# Patient Record
Sex: Male | Born: 1950 | Race: White | Hispanic: No | Marital: Married | Smoking: Never smoker
Health system: Southern US, Community
[De-identification: ages and names within clinical notes are randomized; demographics above are authoritative.]

## PROBLEM LIST (undated history)

## (undated) HISTORY — PX: TONSILLECTOMY: SUR1361

## (undated) HISTORY — PX: DIAGNOSTIC LAPAROSCOPY: SUR761

## (undated) HISTORY — PX: HERNIA REPAIR: SHX51

---

## 2004-06-29 ENCOUNTER — Emergency Department (HOSPITAL_COMMUNITY): Admission: EM | Admit: 2004-06-29 | Discharge: 2004-06-29 | Payer: Self-pay | Admitting: Emergency Medicine

## 2004-07-29 ENCOUNTER — Ambulatory Visit (HOSPITAL_COMMUNITY): Admission: RE | Admit: 2004-07-29 | Discharge: 2004-07-29 | Payer: Self-pay | Admitting: Surgery

## 2011-10-07 ENCOUNTER — Other Ambulatory Visit: Payer: Self-pay | Admitting: Internal Medicine

## 2011-10-07 ENCOUNTER — Ambulatory Visit
Admission: RE | Admit: 2011-10-07 | Discharge: 2011-10-07 | Disposition: A | Discharge status: Home / Self Care | Payer: BC Managed Care – PPO | Source: Ambulatory Visit | Attending: Internal Medicine | Admitting: Internal Medicine

## 2011-10-07 DIAGNOSIS — N5089 Other specified disorders of the male genital organs: Secondary | ICD-10-CM

## 2013-06-10 ENCOUNTER — Other Ambulatory Visit: Payer: Self-pay | Admitting: Internal Medicine

## 2013-06-10 ENCOUNTER — Ambulatory Visit
Admission: RE | Admit: 2013-06-10 | Discharge: 2013-06-10 | Disposition: A | Discharge status: Home / Self Care | Payer: BC Managed Care – PPO | Source: Ambulatory Visit | Attending: Internal Medicine | Admitting: Internal Medicine

## 2013-06-10 DIAGNOSIS — M25511 Pain in right shoulder: Secondary | ICD-10-CM

## 2018-08-29 ENCOUNTER — Ambulatory Visit
Admission: RE | Admit: 2018-08-29 | Discharge: 2018-08-29 | Disposition: A | Discharge status: Home / Self Care | Payer: BC Managed Care – PPO | Source: Ambulatory Visit | Attending: Internal Medicine | Admitting: Internal Medicine

## 2018-08-29 ENCOUNTER — Other Ambulatory Visit: Payer: Self-pay | Admitting: Internal Medicine

## 2018-08-29 DIAGNOSIS — M549 Dorsalgia, unspecified: Secondary | ICD-10-CM

## 2019-01-01 ENCOUNTER — Ambulatory Visit: Payer: BC Managed Care – PPO | Attending: Internal Medicine

## 2019-01-01 DIAGNOSIS — Z20822 Contact with and (suspected) exposure to covid-19: Secondary | ICD-10-CM

## 2019-01-02 LAB — NOVEL CORONAVIRUS, NAA: SARS-CoV-2, NAA: DETECTED — AB

## 2019-01-03 ENCOUNTER — Encounter: Payer: Self-pay | Admitting: *Deleted

## 2019-01-04 HISTORY — PX: MELANOMA EXCISION: SHX5266

## 2019-01-30 DIAGNOSIS — C439 Malignant melanoma of skin, unspecified: Secondary | ICD-10-CM

## 2019-01-30 HISTORY — DX: Malignant melanoma of skin, unspecified: C43.9

## 2019-02-24 ENCOUNTER — Ambulatory Visit: Payer: BC Managed Care – PPO | Attending: Internal Medicine

## 2019-02-24 DIAGNOSIS — Z23 Encounter for immunization: Secondary | ICD-10-CM | POA: Insufficient documentation

## 2019-02-24 NOTE — Progress Notes (Signed)
   Covid-19 Vaccination Clinic  Name:  Steven Rogers    MRN: [id number] DOB: 1950/03/08  02/24/2019  [name] was observed post Covid-19 immunization for 15 minutes without incidence. He was provided with Vaccine Information Sheet and instruction to access the V-Safe system.   [name] was instructed to call 911 with any severe reactions post vaccine: Marland Kitchen Difficulty breathing  . Swelling of your face and throat  . A fast heartbeat  . A bad rash all over your body  . Dizziness and weakness    Immunizations Administered    Name Date Dose VIS Date Route   Pfizer COVID-19 Vaccine 02/24/2019 11:57 AM 0.3 mL 12/14/2018 Intramuscular   Manufacturer: [place]   Lot: [id number]   [place]: [id number]

## 2019-03-20 ENCOUNTER — Ambulatory Visit: Payer: BC Managed Care – PPO | Attending: Internal Medicine

## 2019-03-20 DIAGNOSIS — Z23 Encounter for immunization: Secondary | ICD-10-CM

## 2019-03-20 NOTE — Progress Notes (Signed)
   Covid-19 Vaccination Clinic  Name:  Steven Rogers    MRN: [id number] DOB: [birth year]  03/20/2019  Steven Rogers was observed post Covid-19 immunization for 15 minutes without incident. He was provided with Vaccine Information Sheet and instruction to access the V-Safe system.   Steven Rogers was instructed to call 911 with any severe reactions post vaccine: Marland Kitchen Difficulty breathing  . Swelling of face and throat  . A fast heartbeat  . A bad rash all over body  . Dizziness and weakness   Immunizations Administered    Name Date Dose VIS Date Route   Pfizer COVID-19 Vaccine 03/20/2019  8:18 AM 0.3 mL 12/14/2018 Intramuscular   Manufacturer: [place]   Lot: [id number]   [place]: [id number]

## 2019-12-03 ENCOUNTER — Ambulatory Visit: Payer: Medicare PPO | Admitting: Dermatology

## 2019-12-03 ENCOUNTER — Other Ambulatory Visit: Payer: Self-pay

## 2019-12-03 ENCOUNTER — Encounter: Payer: Self-pay | Admitting: Dermatology

## 2019-12-03 DIAGNOSIS — D485 Neoplasm of uncertain behavior of skin: Secondary | ICD-10-CM

## 2019-12-03 DIAGNOSIS — L570 Actinic keratosis: Secondary | ICD-10-CM

## 2019-12-03 DIAGNOSIS — Z85820 Personal history of malignant melanoma of skin: Secondary | ICD-10-CM

## 2019-12-03 DIAGNOSIS — Z1283 Encounter for screening for malignant neoplasm of skin: Secondary | ICD-10-CM

## 2019-12-03 NOTE — Patient Instructions (Signed)
Biopsy, Surgery (Curettage) & Surgery (Excision) Aftercare Instructions ? ?1. Okay to remove bandage in 24 hours ? ?2. Wash area with soap and water ? ?3. Apply Vaseline to area twice daily until healed (Not Neosporin) ? ?4. Okay to cover with a Band-Aid to decrease the chance of infection or prevent irritation from clothing; also it's okay to uncover lesion at home. ? ?5. Suture instructions: return to our office in 7-10 or 10-14 days for a nurse visit for suture removal. Variable healing with sutures, if pain or itching occurs call our office. It's okay to shower or bathe 24 hours after sutures are given. ? ?6. The following risks Seyler occur after a biopsy, curettage or excision: bleeding, scarring, discoloration, recurrence, infection (redness, yellow drainage, pain or swelling). ? ?7. For questions, concerns and results call our office at Monday-Thursday before 4pm & Friday before 3pm. Biopsy results will be available in 1 week. ? ?

## 2019-12-04 ENCOUNTER — Encounter: Payer: Self-pay | Admitting: Dermatology

## 2019-12-04 NOTE — Progress Notes (Signed)
   Follow-Up Visit   Subjective  Steven Rogers is a 69 y.o. male who presents for the following: Follow-up (MM 01/30/2019 UPPER MID BACK EXC =ST).  New bump left cheek. Location:  Duration:  Quality:  Associated Signs/Symptoms: Modifying Factors:  Severity:  Timing: Context: History of recent melanoma excision.  Objective  Well appearing patient in no apparent distress; mood and affect are within normal limits.  A full examination was performed including scalp, head, eyes, ears, nose, lips, neck, chest, axillae, abdomen, back, buttocks, bilateral upper extremities, bilateral lower extremities, hands, feet, fingers, toes, fingernails, and toenails. All findings within normal limits unless otherwise noted below.   Assessment & Plan    Screening exam for skin cancer Chest - Medial (Center)  Yearly skin checks  AK (actinic keratosis) Mid Forehead  PDT in the future.  Neoplasm of uncertain behavior of skin Left Parotid Area  Skin / nail biopsy Type of biopsy: tangential   Informed consent: discussed and consent obtained   Timeout: patient name, date of birth, surgical site, and procedure verified   Procedure prep:  Patient was prepped and draped in usual sterile fashion (Non sterile) Prep type:  Chlorhexidine Anesthesia: the lesion was anesthetized in a standard fashion   Anesthetic:  1% lidocaine w/ epinephrine 1-100,000 local infiltration Instrument used: flexible razor blade   Hemostasis achieved with: electrodesiccation   Outcome: patient tolerated procedure well   Post-procedure details: wound care instructions given    Specimen 1 - Surgical pathology Differential Diagnosis: R/O BCC vs SCC Check Margins: No Cautery after biopsy  Personal history of malignant melanoma of skin Mid Back  Self examine skin twice annually, dermatologist examination yearly plus as needed.  Encounter for screening for malignant neoplasm of skin Mid Back  Annual skin  examination.     I, Lavonna Monarch, MD, have reviewed all documentation for this visit.  The documentation on 12/04/19 for the exam, diagnosis, procedures, and orders are all accurate and complete.

## 2020-03-09 ENCOUNTER — Other Ambulatory Visit: Payer: Self-pay | Admitting: Surgery

## 2020-03-09 DIAGNOSIS — K4090 Unilateral inguinal hernia, without obstruction or gangrene, not specified as recurrent: Secondary | ICD-10-CM

## 2020-03-24 ENCOUNTER — Ambulatory Visit
Admission: RE | Admit: 2020-03-24 | Discharge: 2020-03-24 | Disposition: A | Discharge status: Home / Self Care | Payer: Medicare PPO | Source: Ambulatory Visit | Attending: Surgery | Admitting: Surgery

## 2020-03-24 DIAGNOSIS — K4090 Unilateral inguinal hernia, without obstruction or gangrene, not specified as recurrent: Secondary | ICD-10-CM

## 2020-03-24 MED ORDER — IOPAMIDOL (ISOVUE-300) INJECTION 61%
100.0000 mL | Freq: Once | INTRAVENOUS | Status: AC | PRN
  Administered 2020-03-24: 100 mL via INTRAVENOUS

## 2020-04-03 ENCOUNTER — Ambulatory Visit: Payer: Self-pay | Admitting: General Surgery

## 2020-04-03 NOTE — H&P (Signed)
tory of Present Illness Ralene Ok MD; 04/03/2020 10:54 AM) The patient is a 70 year old male who presents with an inguinal hernia. Patient is a 70 year old male who follows up today after CT scan. Patient states she's had a long history of a left inguinal hernia. Patient's had some fullness to his left scrotum. Patient CT scan revealed a large fat-containing inguinal hernia. I did review the scans personally. Patient also with a small umbilical hernia.  Patient is a previous laparoscopic right hernia repair with mesh.    ------------------------------ Referred by Dr. Franchot Gallo for possible left inguinal hernia  This is a 70 year old male with a past history of right inguinal hernia repair with mesh who presents with several years of slowly enlarging swelling in his left hemiscrotum. Previously had been told that he had a hydrocele in this area. However recently he underwent a repeat ultrasound of his scrotum that was read as no hydrocele but a fatty mass in the left scrotum. This was felt to possibly represent a left inguinal hernia so he is referred to [place] for evaluation. This area is not reducible. He reports no constipation or difficulty with bowel movements.  The patient incidentally has also had a small umbilical hernia his entire life. This has gotten slightly larger but is not causing any discomfort. He also incidentally had lumbar films 2 years ago that showed calcified gallstones. He denies any gastrointestinal symptoms that could be related to his gallbladder.    Physical Exam Ralene Ok, MD; 04/03/2020 10:55 AM) The physical exam findings are as follows: Note: Constitutional: WDWN in NAD, conversant, no obvious deformities; resting comfortably Eyes: Pupils equal, round; sclera anicteric; moist conjunctiva; no lid lag HENT: Oral mucosa moist; good dentition Neck: No masses palpated, trachea midline; no thyromegaly Lungs: CTA bilaterally; normal  respiratory effort CV: Regular rate and rhythm; no murmurs; extremities well-perfused with no edema Abd: +bowel sounds, soft, non-tender, no palpable organomegaly; wide umbilicus with small palpable defect GU: bilateral descended testes; significant fullness in left hemiscrotum; no bulge or swelling in the left groin with Valsalva Musc: Normal gait; no apparent clubbing or cyanosis in extremities Lymphatic: No palpable cervical or axillary lymphadenopathy Skin: Warm, dry; no sign of jaundice Psychiatric - alert and oriented x 4; calm mood and affect    Assessment & Plan Ralene Ok MD; 04/03/2020 10:55 AM) LEFT INGUINAL HERNIA (K40.90) Impression: Patient is a 70 year old male, with history of hyperlipidemia, with a large left inguinal scrotal hernia. 1. The patient will like to proceed to the operating room for robotic left inguinal hernia repair with mesh, and umbilical hernia repair with mesh..  2. I discussed with the patient the signs and symptoms of incarceration and strangulation and the need to proceed to the ER should they occur.  3. I discussed with the patient the risks and benefits of the procedure to include but not limited to: Infection, bleeding, damage to surrounding structures, possible need for further surgery, possible nerve pain, and possible recurrence. The patient was understanding and wishes to proceed. Current Plans You are being scheduled for surgery- Our schedulers will call you.  You should hear from our office's scheduling department within 5 working days about the location, date, and time of surgery. We try to make accommodations for patient's preferences in scheduling surgery, but sometimes the OR schedule or the surgeon's schedule prevents [place] from making those accommodations.  If you have not heard from our office [phone]) in 5 working days, call the office and ask  for your surgeon's nurse.  If you have other questions about your diagnosis,  plan, or surgery, call the office and ask for your surgeon's nurse.  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 This Garron represent a scrotal lipoma. We will obtain a CT scan of the abdomen and pelvis to evaluate his gallbladder, umbilical hernia for possible bowel incarceration, as well as to rule out a left inguinal hernia. We will see the patient back after his CT scan.

## 2020-06-12 ENCOUNTER — Other Ambulatory Visit (HOSPITAL_COMMUNITY): Payer: Medicare PPO

## 2020-06-16 ENCOUNTER — Ambulatory Visit: Admit: 2020-06-16 | Payer: Medicare PPO | Admitting: General Surgery

## 2020-06-16 SURGERY — REPAIR, HERNIA, INGUINAL, ROBOT-ASSISTED, LAPAROSCOPIC, USING MESH
Anesthesia: General

## 2020-07-08 NOTE — Progress Notes (Addendum)
Surgical Instructions    Your procedure is scheduled on 07/14/20.  Report to [facility] Main Entrance "A" at 11:30 A.M., then check in with the Admitting office.  Call this number if you have problems the morning of surgery:  [phone]   If you have any questions prior to your surgery date call [phone]: Open Monday-Friday 8am-4pm    Remember:  Do not eat after midnight the night before your surgery  You Briguglio drink clear liquids until 10:30 AM the morning of your surgery.   Clear liquids allowed are: Water, Non-Citrus Juices (without pulp), Carbonated Beverages, Clear Tea, Black Coffee Only, and Gatorade   Patient Instructions  The night before surgery:  No food after midnight. ONLY clear liquids after midnight  The day of surgery (if you do NOT have diabetes):  Drink ONE (1) Pre-Surgery Clear Ensure by 10:30 AM the morning of surgery. Drink in one sitting. Do not sip.  This drink was given to you during your hospital  pre-op appointment visit.  Nothing else to drink after completing the  Pre-Surgery Clear Ensure.          If you have questions, please contact your surgeon's office.    Take these medicines the morning of surgery with A SIP OF WATER  atorvastatin (LIPITOR)   If needed: cetirizine (ZYRTEC)   As of today, STOP taking any Aspirin (unless otherwise instructed by your surgeon) Aleve, Naproxen, Ibuprofen, Motrin, Advil, Goody's, BC's, all herbal medications, fish oil, and all vitamins.          Do not wear jewelry  Do not wear lotions, powders, colognes, or deodorant. Do not shave 48 hours prior to surgery.  Men Golla shave face and neck. Do not bring valuables to the hospital.   [facility] is not responsible for any belongings or valuables.  Do NOT Smoke (Tobacco/Vaping) or drink Alcohol 24 hours prior to your procedure If you use a CPAP at night, you Krouse bring all equipment for your overnight stay.   Contacts, glasses, dentures or bridgework [place]  not be worn into surgery, please bring cases for these belongings   For patients admitted to the hospital, discharge time will be determined by your treatment team.   Patients discharged the day of surgery will not be allowed to drive home, and someone needs to stay with them for 24 hours.  ONLY 1 SUPPORT PERSON Carden BE PRESENT WHILE YOU ARE IN SURGERY. IF YOU ARE TO BE ADMITTED ONCE YOU ARE IN YOUR ROOM YOU WILL BE ALLOWED TWO (2) VISITORS.  Minor children Keithley have two parents present. Special consideration for safety and communication needs will be reviewed on a case by case basis.  Special instructions:    Oral Hygiene is also important to reduce your risk of infection.  Remember - BRUSH YOUR TEETH THE MORNING OF SURGERY WITH YOUR REGULAR TOOTHPASTE   [place]- Preparing For Surgery  Before surgery, you can play an important role. Because skin is not sterile, your skin needs to be as free of germs as possible. You can reduce the number of germs on your skin by washing with CHG (chlorahexidine gluconate) Soap before surgery.  CHG is an antiseptic cleaner which kills germs and bonds with the skin to continue killing germs even after washing.     Please do not use if you have an allergy to CHG or antibacterial soaps. If your skin becomes reddened/irritated stop using the CHG.  Do not shave (including legs and underarms)  for at least 48 hours prior to first CHG shower. It is OK to shave your face.  Please follow these instructions carefully.     Shower the NIGHT BEFORE SURGERY and the MORNING OF SURGERY with CHG Soap.   If you chose to wash your hair, wash your hair first as usual with your normal shampoo. After you shampoo, rinse your hair and body thoroughly to remove the shampoo.  Then [employer] and genitals (private parts) with your normal soap and rinse thoroughly to remove soap.  After that Use CHG Soap as you would any other liquid soap. You can apply CHG directly to the skin and  wash gently with a scrungie or a clean washcloth.   Apply the CHG Soap to your body ONLY FROM THE NECK DOWN.  Do not use on open wounds or open sores. Avoid contact with your eyes, ears, mouth and genitals (private parts). Wash Face and genitals (private parts)  with your normal soap.   Wash thoroughly, paying special attention to the area where your surgery will be performed.  Thoroughly rinse your body with warm water from the neck down.  DO NOT shower/wash with your normal soap after using and rinsing off the CHG Soap.  Pat yourself dry with a CLEAN TOWEL.  Wear CLEAN PAJAMAS to bed the night before surgery  Place CLEAN SHEETS on your bed the night before your surgery  DO NOT SLEEP WITH PETS.   Day of Surgery:  Take a shower with CHG soap. Wear Clean/Comfortable clothing the morning of surgery Do not apply any deodorants/lotions.   Remember to brush your teeth WITH YOUR REGULAR TOOTHPASTE.   Please read over the following fact sheets that you were given.

## 2020-07-09 ENCOUNTER — Other Ambulatory Visit: Payer: Self-pay

## 2020-07-09 ENCOUNTER — Encounter (HOSPITAL_COMMUNITY)
Admission: RE | Admit: 2020-07-09 | Discharge: 2020-07-09 | Disposition: A | Discharge status: Home / Self Care | Payer: Medicare PPO | Source: Ambulatory Visit | Attending: General Surgery | Admitting: General Surgery

## 2020-07-09 ENCOUNTER — Encounter (HOSPITAL_COMMUNITY): Payer: Self-pay

## 2020-07-09 DIAGNOSIS — Z01812 Encounter for preprocedural laboratory examination: Secondary | ICD-10-CM | POA: Diagnosis not present

## 2020-07-09 LAB — CBC
HCT: 45.6 % (ref 39.0–52.0)
Hemoglobin: 15.5 g/dL (ref 13.0–17.0)
MCH: 34.5 pg — ABNORMAL HIGH (ref 26.0–34.0)
MCHC: 34 g/dL (ref 30.0–36.0)
MCV: 101.6 fL — ABNORMAL HIGH (ref 80.0–100.0)
Platelets: 194 10*3/uL (ref 150–400)
RBC: 4.49 MIL/uL (ref 4.22–5.81)
RDW: 13.1 % (ref 11.5–15.5)
WBC: 6.3 10*3/uL (ref 4.0–10.5)
nRBC: 0 % (ref 0.0–0.2)

## 2020-07-09 NOTE — Progress Notes (Signed)
PCP -  Lorene Dy, MD  PPM/ICD - denies  Chest x-ray - denies EKG - denies Stress Test - pt states "years ago." No issues found ECHO - denies Cardiac Cath - denies  Sleep Study - denies  DM: denies   ERAS Protcol - yes PRE-SURGERY Ensure   COVID TEST- n/a ambulatory surgery   Anesthesia review: no  Patient denies shortness of breath, fever, cough and chest pain at PAT appointment   All instructions explained to the patient, with a verbal understanding of the material. Patient agrees to go over the instructions while at home for a better understanding. Patient also instructed to self quarantine after being tested for COVID-19. The opportunity to ask questions was provided.

## 2020-07-14 ENCOUNTER — Encounter (HOSPITAL_COMMUNITY): Payer: Self-pay | Admitting: General Surgery

## 2020-07-14 ENCOUNTER — Ambulatory Visit (HOSPITAL_COMMUNITY): Payer: Medicare PPO | Admitting: Certified Registered"

## 2020-07-14 ENCOUNTER — Encounter (HOSPITAL_COMMUNITY)
Admission: RE | Disposition: A | Discharge status: Home / Self Care | Payer: Self-pay | Source: Home / Self Care | Attending: General Surgery

## 2020-07-14 ENCOUNTER — Other Ambulatory Visit: Payer: Self-pay

## 2020-07-14 ENCOUNTER — Ambulatory Visit (HOSPITAL_COMMUNITY)
Admission: RE | Admit: 2020-07-14 | Discharge: 2020-07-14 | Disposition: A | Discharge status: Home / Self Care | Payer: Medicare PPO | Attending: General Surgery | Admitting: General Surgery

## 2020-07-14 DIAGNOSIS — K4030 Unilateral inguinal hernia, with obstruction, without gangrene, not specified as recurrent: Secondary | ICD-10-CM | POA: Diagnosis not present

## 2020-07-14 DIAGNOSIS — K4090 Unilateral inguinal hernia, without obstruction or gangrene, not specified as recurrent: Secondary | ICD-10-CM | POA: Diagnosis present

## 2020-07-14 DIAGNOSIS — K429 Umbilical hernia without obstruction or gangrene: Secondary | ICD-10-CM | POA: Diagnosis not present

## 2020-07-14 DIAGNOSIS — E785 Hyperlipidemia, unspecified: Secondary | ICD-10-CM | POA: Diagnosis not present

## 2020-07-14 HISTORY — PX: UMBILICAL HERNIA REPAIR: SHX196

## 2020-07-14 HISTORY — PX: XI ROBOTIC ASSISTED INGUINAL HERNIA REPAIR WITH MESH: SHX6706

## 2020-07-14 SURGERY — REPAIR, HERNIA, INGUINAL, ROBOT-ASSISTED, LAPAROSCOPIC, USING MESH
Anesthesia: General | Site: Abdomen

## 2020-07-14 MED ORDER — ROCURONIUM BROMIDE 10 MG/ML (PF) SYRINGE
PREFILLED_SYRINGE | INTRAVENOUS | Status: DC | PRN
  Administered 2020-07-14: 40 mg via INTRAVENOUS
  Administered 2020-07-14: 60 mg via INTRAVENOUS

## 2020-07-14 MED ORDER — DEXMEDETOMIDINE (PRECEDEX) IN NS 20 MCG/5ML (4 MCG/ML) IV SYRINGE
PREFILLED_SYRINGE | INTRAVENOUS | Status: DC | PRN
  Administered 2020-07-14 (×2): 8 ug via INTRAVENOUS
  Administered 2020-07-14: 4 ug via INTRAVENOUS

## 2020-07-14 MED ORDER — PROPOFOL 10 MG/ML IV BOLUS
INTRAVENOUS | Status: DC | PRN
  Administered 2020-07-14: 160 mg via INTRAVENOUS

## 2020-07-14 MED ORDER — BUPIVACAINE HCL 0.25 % IJ SOLN: INTRAMUSCULAR | Status: DC | PRN

## 2020-07-14 MED ORDER — CHLORHEXIDINE GLUCONATE 0.12 % MT SOLN
15.0000 mL | Freq: Once | OROMUCOSAL | Status: AC
  Administered 2020-07-14: 15 mL via OROMUCOSAL
  Filled 2020-07-14: qty 15

## 2020-07-14 MED ORDER — 0.9 % SODIUM CHLORIDE (POUR BTL) OPTIME
TOPICAL | Status: DC | PRN
  Administered 2020-07-14: 1000 mL

## 2020-07-14 MED ORDER — SUGAMMADEX SODIUM 200 MG/2ML IV SOLN
INTRAVENOUS | Status: DC | PRN
  Administered 2020-07-14: 200 mg via INTRAVENOUS

## 2020-07-14 MED ORDER — BUPIVACAINE-EPINEPHRINE (PF) 0.25% -1:200000 IJ SOLN
INTRAMUSCULAR | Status: AC
  Filled 2020-07-14: qty 30

## 2020-07-14 MED ORDER — ONDANSETRON HCL 4 MG/2ML IJ SOLN
INTRAMUSCULAR | Status: AC
  Filled 2020-07-14: qty 2

## 2020-07-14 MED ORDER — LIDOCAINE 2% (20 MG/ML) 5 ML SYRINGE
INTRAMUSCULAR | Status: DC | PRN
  Administered 2020-07-14: 100 mg via INTRAVENOUS

## 2020-07-14 MED ORDER — ONDANSETRON HCL 4 MG/2ML IJ SOLN
INTRAMUSCULAR | Status: DC | PRN
  Administered 2020-07-14: 4 mg via INTRAVENOUS

## 2020-07-14 MED ORDER — DEXAMETHASONE SODIUM PHOSPHATE 10 MG/ML IJ SOLN
INTRAMUSCULAR | Status: AC
  Filled 2020-07-14: qty 1

## 2020-07-14 MED ORDER — FENTANYL CITRATE (PF) 100 MCG/2ML IJ SOLN: 25.0000 ug | INTRAMUSCULAR | Status: DC | PRN

## 2020-07-14 MED ORDER — CEFAZOLIN SODIUM-DEXTROSE 2-4 GM/100ML-% IV SOLN
INTRAVENOUS | Status: AC
  Filled 2020-07-14: qty 100

## 2020-07-14 MED ORDER — LACTATED RINGERS IV SOLN: INTRAVENOUS | Status: DC

## 2020-07-14 MED ORDER — PHENYLEPHRINE 40 MCG/ML (10ML) SYRINGE FOR IV PUSH (FOR BLOOD PRESSURE SUPPORT)
PREFILLED_SYRINGE | INTRAVENOUS | Status: AC
  Filled 2020-07-14: qty 10

## 2020-07-14 MED ORDER — FENTANYL CITRATE (PF) 250 MCG/5ML IJ SOLN
INTRAMUSCULAR | Status: AC
  Filled 2020-07-14: qty 5

## 2020-07-14 MED ORDER — ROCURONIUM BROMIDE 10 MG/ML (PF) SYRINGE
PREFILLED_SYRINGE | INTRAVENOUS | Status: AC
  Filled 2020-07-14: qty 10

## 2020-07-14 MED ORDER — TRAMADOL HCL 50 MG PO TABS
50.0000 mg | ORAL_TABLET | Freq: Once | ORAL | Status: AC
  Administered 2020-07-14: 50 mg via ORAL

## 2020-07-14 MED ORDER — TRAMADOL HCL 50 MG PO TABS: 50.0000 mg | ORAL_TABLET | Freq: Four times a day (QID) | ORAL | 0 refills | Status: AC | PRN

## 2020-07-14 MED ORDER — LIDOCAINE 2% (20 MG/ML) 5 ML SYRINGE
INTRAMUSCULAR | Status: AC
  Filled 2020-07-14: qty 5

## 2020-07-14 MED ORDER — BUPIVACAINE-EPINEPHRINE 0.5% -1:200000 IJ SOLN
INTRAMUSCULAR | Status: DC | PRN
  Administered 2020-07-14: 8 mL

## 2020-07-14 MED ORDER — PROPOFOL 10 MG/ML IV BOLUS
INTRAVENOUS | Status: AC
  Filled 2020-07-14: qty 20

## 2020-07-14 MED ORDER — BUPIVACAINE LIPOSOME 1.3 % IJ SUSP
INTRAMUSCULAR | Status: DC | PRN
  Administered 2020-07-14: 20 mL

## 2020-07-14 MED ORDER — TRAMADOL HCL 50 MG PO TABS
ORAL_TABLET | ORAL | Status: AC
  Filled 2020-07-14: qty 1

## 2020-07-14 MED ORDER — BUPIVACAINE LIPOSOME 1.3 % IJ SUSP
INTRAMUSCULAR | Status: AC
  Filled 2020-07-14: qty 20

## 2020-07-14 MED ORDER — SODIUM CHLORIDE (PF) 0.9 % IJ SOLN
INTRAMUSCULAR | Status: DC | PRN
  Administered 2020-07-14: 20 mL

## 2020-07-14 MED ORDER — PHENYLEPHRINE HCL (PRESSORS) 10 MG/ML IV SOLN
INTRAVENOUS | Status: DC | PRN
  Administered 2020-07-14: 120 ug via INTRAVENOUS
  Administered 2020-07-14 (×2): 80 ug via INTRAVENOUS

## 2020-07-14 MED ORDER — FENTANYL CITRATE (PF) 100 MCG/2ML IJ SOLN
INTRAMUSCULAR | Status: DC | PRN
  Administered 2020-07-14: 100 ug via INTRAVENOUS
  Administered 2020-07-14 (×3): 50 ug via INTRAVENOUS

## 2020-07-14 MED ORDER — CEFAZOLIN SODIUM-DEXTROSE 2-3 GM-%(50ML) IV SOLR
INTRAVENOUS | Status: DC | PRN
  Administered 2020-07-14: 2 g via INTRAVENOUS

## 2020-07-14 MED ORDER — ACETAMINOPHEN 500 MG PO TABS
ORAL_TABLET | ORAL | Status: AC
  Administered 2020-07-14: 1000 mg
  Filled 2020-07-14: qty 2

## 2020-07-14 MED ORDER — ORAL CARE MOUTH RINSE: 15.0000 mL | Freq: Once | OROMUCOSAL | Status: AC

## 2020-07-14 SURGICAL SUPPLY — 84 items
ADH SKN CLS APL DERMABOND .7 (GAUZE/BANDAGES/DRESSINGS) ×2
APL PRP STRL LF DISP 70% ISPRP (MISCELLANEOUS) ×2
BAG COUNTER SPONGE SURGICOUNT (BAG) ×3 IMPLANT
BAG SPNG CNTER NS LX DISP (BAG) ×2
BAG SURGICOUNT SPONGE COUNTING (BAG) ×1
BIOPATCH RED 1 DISK 7.0 (GAUZE/BANDAGES/DRESSINGS) ×1 IMPLANT
BIOPATCH RED 1IN DISK 7.0MM (GAUZE/BANDAGES/DRESSINGS) ×1
BLADE CLIPPER SURG (BLADE) IMPLANT
CANISTER SUCT 3000ML PPV (MISCELLANEOUS) ×4 IMPLANT
CHLORAPREP W/TINT 26 (MISCELLANEOUS) ×4 IMPLANT
COVER MAYO STAND STRL (DRAPES) ×4 IMPLANT
COVER SURGICAL LIGHT HANDLE (MISCELLANEOUS) ×4 IMPLANT
COVER TIP SHEARS 8 DVNC (MISCELLANEOUS) ×2 IMPLANT
COVER TIP SHEARS 8MM DA VINCI (MISCELLANEOUS) ×4
DECANTER SPIKE VIAL GLASS SM (MISCELLANEOUS) ×4 IMPLANT
DEFOGGER SCOPE WARMER CLEARIFY (MISCELLANEOUS) ×4 IMPLANT
DERMABOND ADVANCED (GAUZE/BANDAGES/DRESSINGS) ×2
DERMABOND ADVANCED .7 DNX12 (GAUZE/BANDAGES/DRESSINGS) ×2 IMPLANT
DEVICE TROCAR PUNCTURE CLOSURE (ENDOMECHANICALS) ×4 IMPLANT
DRAIN JP 10F RND RADIO (DRAIN) ×2 IMPLANT
DRAPE ARM DVNC X/XI (DISPOSABLE) ×8 IMPLANT
DRAPE COLUMN DVNC XI (DISPOSABLE) ×2 IMPLANT
DRAPE CV SPLIT W-CLR ANES SCRN (DRAPES) ×4 IMPLANT
DRAPE DA VINCI XI ARM (DISPOSABLE) ×16
DRAPE DA VINCI XI COLUMN (DISPOSABLE) ×4
DRAPE LAPAROTOMY 100X72 PEDS (DRAPES) ×2 IMPLANT
DRAPE ORTHO SPLIT 77X108 STRL (DRAPES) ×4
DRAPE SURG ORHT 6 SPLT 77X108 (DRAPES) ×2 IMPLANT
DRSG TEGADERM 2-3/8X2-3/4 SM (GAUZE/BANDAGES/DRESSINGS) ×2 IMPLANT
ELECT REM PT RETURN 9FT ADLT (ELECTROSURGICAL) ×4
ELECTRODE REM PT RTRN 9FT ADLT (ELECTROSURGICAL) ×2 IMPLANT
EVACUATOR SILICONE 100CC (DRAIN) ×2 IMPLANT
GAUZE 4X4 16PLY ~~LOC~~+RFID DBL (SPONGE) ×4 IMPLANT
GLOVE SRG 8 PF TXTR STRL LF DI (GLOVE) ×2 IMPLANT
GLOVE SURG ENC MOIS LTX SZ7.5 (GLOVE) ×8 IMPLANT
GLOVE SURG UNDER POLY LF SZ8 (GLOVE) ×4
GOWN STRL REUS W/ TWL LRG LVL3 (GOWN DISPOSABLE) ×4 IMPLANT
GOWN STRL REUS W/ TWL XL LVL3 (GOWN DISPOSABLE) ×4 IMPLANT
GOWN STRL REUS W/TWL 2XL LVL3 (GOWN DISPOSABLE) ×4 IMPLANT
GOWN STRL REUS W/TWL LRG LVL3 (GOWN DISPOSABLE) ×8
GOWN STRL REUS W/TWL XL LVL3 (GOWN DISPOSABLE) ×8
KIT BASIN OR (CUSTOM PROCEDURE TRAY) ×4 IMPLANT
KIT TURNOVER KIT B (KITS) ×4 IMPLANT
MARKER SKIN DUAL TIP RULER LAB (MISCELLANEOUS) ×4 IMPLANT
MESH PROGRIP LAP SELF FIXATING (Mesh General) ×4 IMPLANT
MESH PROGRIP LAP SLF FIX 16X12 (Mesh General) ×2 IMPLANT
MESH VENTRALEX ST 2.5 CRC MED (Mesh General) ×2 IMPLANT
NDL HYPO 25GX1X1/2 BEV (NEEDLE) ×2 IMPLANT
NDL INSUFFLATION 14GA 120MM (NEEDLE) ×2 IMPLANT
NEEDLE HYPO 22GX1.5 SAFETY (NEEDLE) ×4 IMPLANT
NEEDLE HYPO 25GX1X1/2 BEV (NEEDLE) ×4 IMPLANT
NEEDLE INSUFFLATION 14GA 120MM (NEEDLE) ×4 IMPLANT
NS IRRIG 1000ML POUR BTL (IV SOLUTION) ×4 IMPLANT
OBTURATOR OPTICAL STANDARD 8MM (TROCAR)
OBTURATOR OPTICAL STND 8 DVNC (TROCAR)
OBTURATOR OPTICALSTD 8 DVNC (TROCAR) IMPLANT
PACK GENERAL/GYN (CUSTOM PROCEDURE TRAY) ×4 IMPLANT
PAD ARMBOARD 7.5X6 YLW CONV (MISCELLANEOUS) ×8 IMPLANT
PENCIL SMOKE EVACUATOR (MISCELLANEOUS) ×6 IMPLANT
SEAL CANN UNIV 5-8 DVNC XI (MISCELLANEOUS) ×4 IMPLANT
SEAL XI 5MM-8MM UNIVERSAL (MISCELLANEOUS) ×8
SET IRRIG TUBING LAPAROSCOPIC (IRRIGATION / IRRIGATOR) IMPLANT
SET TUBE SMOKE EVAC HIGH FLOW (TUBING) ×4 IMPLANT
STOPCOCK 4 WAY LG BORE MALE ST (IV SETS) ×4 IMPLANT
SUT ETHIBOND 0 MO6 C/R (SUTURE) ×2 IMPLANT
SUT ETHILON 2 0 FS 18 (SUTURE) ×2 IMPLANT
SUT MNCRL AB 4-0 PS2 18 (SUTURE) ×4 IMPLANT
SUT PROLENE 0 CT 1 30 (SUTURE) ×6 IMPLANT
SUT VIC AB 2-0 CT1 27 (SUTURE)
SUT VIC AB 2-0 CT1 TAPERPNT 27 (SUTURE) ×2 IMPLANT
SUT VIC AB 2-0 SH 27 (SUTURE)
SUT VIC AB 2-0 SH 27X BRD (SUTURE) IMPLANT
SUT VIC AB 3-0 SH 27 (SUTURE) ×8
SUT VIC AB 3-0 SH 27XBRD (SUTURE) ×2 IMPLANT
SUT VLOC 180 2-0 6IN GS21 (SUTURE) ×6 IMPLANT
SUT VLOC 180 2-0 9IN GS21 (SUTURE) ×2 IMPLANT
SYR 30ML SLIP (SYRINGE) ×4 IMPLANT
SYR CONTROL 10ML LL (SYRINGE) ×4 IMPLANT
SYR TOOMEY 50ML (SYRINGE) ×4 IMPLANT
TOWEL GREEN STERILE (TOWEL DISPOSABLE) ×4 IMPLANT
TOWEL GREEN STERILE FF (TOWEL DISPOSABLE) ×4 IMPLANT
TRAY FOLEY MTR SLVR 14FR STAT (SET/KITS/TRAYS/PACK) IMPLANT
TRAY FOLEY MTR SLVR 16FR STAT (SET/KITS/TRAYS/PACK) IMPLANT
TRAY LAPAROSCOPIC MC (CUSTOM PROCEDURE TRAY) ×4 IMPLANT

## 2020-07-14 NOTE — Anesthesia Procedure Notes (Signed)
Procedure Name: Intubation Date/Time: 07/14/2020 1:30 PM Performed by: Myna Bright, CRNA Pre-anesthesia Checklist: Patient identified, Emergency Drugs available, Suction available and Patient being monitored Patient Re-evaluated:Patient Re-evaluated prior to induction Oxygen Delivery Method: Circle System Utilized Preoxygenation: Pre-oxygenation with 100% oxygen Induction Type: IV induction Ventilation: Mask ventilation without difficulty Laryngoscope Size: Miller and 2 Tube type: Oral Tube size: 7.5 mm Number of attempts: 1 Airway Equipment and Method: Stylet and Oral airway Placement Confirmation: ETT inserted through vocal cords under direct vision, positive ETCO2 and breath sounds checked- equal and bilateral Tube secured with: Tape Dental Injury: Teeth and Oropharynx as per pre-operative assessment

## 2020-07-14 NOTE — Anesthesia Preprocedure Evaluation (Signed)
Anesthesia Evaluation  Patient identified by MRN, date of birth, ID band Patient awake    Reviewed: Allergy & Precautions, NPO status , Patient's Chart, lab work & pertinent test results  Airway Mallampati: II  TM Distance: >3 FB Neck ROM: Full    Dental  (+) Teeth Intact, Dental Advisory Given   Pulmonary neg pulmonary ROS,    Pulmonary exam normal breath sounds clear to auscultation       Cardiovascular negative cardio ROS Normal cardiovascular exam Rhythm:Regular Rate:Normal     Neuro/Psych negative neurological ROS     GI/Hepatic Neg liver ROS, Inguinal and umbilical hernia    Endo/Other  Obesity   Renal/GU negative Renal ROS     Musculoskeletal negative musculoskeletal ROS (+)   Abdominal   Peds  Hematology negative hematology ROS (+)   Anesthesia Other Findings Day of surgery medications reviewed with the patient.  Reproductive/Obstetrics                             Anesthesia Physical Anesthesia Plan  ASA: 2  Anesthesia Plan: General   Post-op Pain Management:    Induction: Intravenous  PONV Risk Score and Plan: 2 and Midazolam, Dexamethasone and Ondansetron  Airway Management Planned: Oral ETT  Additional Equipment:   Intra-op Plan:   Post-operative Plan: Extubation in OR  Informed Consent: I have reviewed the patients History and Physical, chart, labs and discussed the procedure including the risks, benefits and alternatives for the proposed anesthesia with the patient or authorized representative who has indicated his/her understanding and acceptance.     Dental advisory given  Plan Discussed with: CRNA  Anesthesia Plan Comments:         Anesthesia Quick Evaluation

## 2020-07-14 NOTE — Transfer of Care (Signed)
Immediate Anesthesia Transfer of Care Note  Patient: Steven Rogers  Procedure(s) Performed: XI ROBOTIC ASSISTED LEFT INGUINAL HERNIA REPAIR WITH MESH (Left: Abdomen) HERNIA REPAIR UMBILICAL ADULT WITH MESH  Patient Location: PACU  Anesthesia Type:General  Level of Consciousness: awake, alert , oriented and patient cooperative  Airway & Oxygen Therapy: Patient Spontanous Breathing and Patient connected to face mask oxygen  Post-op Assessment: Report given to RN, Post -op Vital signs reviewed and stable and Patient moving all extremities  Post vital signs: Reviewed and stable  Last Vitals:  Vitals Value Taken Time  BP 133/90 07/14/20 1544  Temp    Pulse 91 07/14/20 1544  Resp 14 07/14/20 1544  SpO2 98 % 07/14/20 1544  Vitals shown include unvalidated device data.  Last Pain:  Vitals:   07/14/20 1209  TempSrc:   PainSc: 0-No pain         Complications: No notable events documented.

## 2020-07-14 NOTE — Interval H&P Note (Signed)
History and Physical Interval Note:  07/14/2020 12:50 PM  Steven Rogers  has presented today for surgery, with the diagnosis of LEFT INGUINAL HERNIA UMBILICAL HERNIA.  The various methods of treatment have been discussed with the patient and family. After consideration of risks, benefits and other options for treatment, the patient has consented to  Procedure(s): XI ROBOTIC ASSISTED LEFT INGUINAL HERNIA REPAIR WITH MESH (Left) HERNIA REPAIR UMBILICAL ADULT WITH MESH (N/A) as a surgical intervention.  The patient's history has been reviewed, patient examined, no change in status, stable for surgery.  I have reviewed the patient's chart and labs.  Questions were answered to the patient's satisfaction.     Ralene Ok

## 2020-07-14 NOTE — H&P (Signed)
History of Present Illness  The patient is a 70 year old male who presents with an inguinal hernia. Patient is a 71 year old male who follows up today after CT scan.  Patient states she's had a long history of a left inguinal hernia.  Patient's had some fullness to his left scrotum.  Patient CT scan revealed a large fat-containing inguinal hernia.  I did review the scans personally.  Patient also with a small umbilical hernia.  Patient is a previous laparoscopic right hernia repair with mesh.    ------------------------------ Referred by Dr. Franchot Gallo for possible left inguinal hernia  This is a 70 year old male with a past history of right inguinal hernia repair with mesh who presents with several years of slowly enlarging swelling in his left hemiscrotum.  Previously had been told that he had a hydrocele in this area.  However recently he underwent a repeat ultrasound of his scrotum that was read as no hydrocele but a fatty mass in the left scrotum.  This was felt to possibly represent a left inguinal hernia so he is referred to [place] for evaluation.  This area is not reducible.  He reports no constipation or difficulty with bowel movements.  The patient incidentally has also had a small umbilical hernia his entire life.  This has gotten slightly larger but is not causing any discomfort. He also incidentally had lumbar films 2 years ago that showed calcified gallstones.  He denies any gastrointestinal symptoms that could be related to his gallbladder.    Physical Exam  The physical exam findings are as follows: Note:  Constitutional:  WDWN in NAD, conversant, no obvious deformities; resting comfortably Eyes:  Pupils equal, round; sclera anicteric; moist conjunctiva; no lid lag HENT:  Oral mucosa moist; good dentition Neck:  No masses palpated, trachea midline; no thyromegaly Lungs:  CTA bilaterally; normal respiratory effort CV:  Regular rate and rhythm; no murmurs; extremities  well-perfused with no edema Abd:  +bowel sounds, soft, non-tender, no palpable organomegaly; wide umbilicus with small palpable defect GU:  bilateral descended testes; significant fullness in left hemiscrotum; no bulge or swelling in the left groin with Valsalva Musc:  Normal gait; no apparent clubbing or cyanosis in extremities Lymphatic:  No palpable cervical or axillary lymphadenopathy Skin:  Warm, dry; no sign of jaundice Psychiatric - alert and oriented x 4; calm mood and affect    Assessment & Plan  LEFT INGUINAL HERNIA (K40.90) Impression: Patient is a 70 year old male, with history of hyperlipidemia, with a large left inguinal scrotal hernia. 1. The patient will like to proceed to the operating room for robotic left inguinal hernia repair with mesh, and umbilical hernia repair with mesh..  2. I discussed with the patient the signs and symptoms of incarceration and strangulation and the need to proceed to the ER should they occur.  3. I discussed with the patient the risks and benefits of the procedure to include but not limited to: Infection, bleeding, damage to surrounding structures, possible need for further surgery, possible nerve pain, and possible recurrence. The patient was understanding and wishes to proceed. Current Plans You are being scheduled for surgery - Our schedulers will call you.   You should hear from our office's scheduling department within 5 working days about the location, date, and time of surgery.  We try to make accommodations for patient's preferences in scheduling surgery, but sometimes the OR schedule or the surgeon's schedule prevents [place] from making those accommodations.  If you have not heard from  our office [phone]) in 5 working days, call the office and ask for your surgeon's nurse.  If you have other questions about your diagnosis, plan, or surgery, call the office and ask for your surgeon's nurse.  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  This Casstevens represent a scrotal lipoma.  We will obtain a CT scan of the abdomen and pelvis to evaluate his gallbladder, umbilical hernia for possible bowel incarceration, as well as to rule out a left inguinal hernia.  We will see the patient back after his CT scan.

## 2020-07-14 NOTE — Op Note (Signed)
07/14/2020  3:37 PM  PATIENT:  Steven Rogers  70 y.o. male  PRE-OPERATIVE DIAGNOSIS:  LEFT INGUINAL HERNIA UMBILICAL HERNIA  POST-OPERATIVE DIAGNOSIS:  Incarcerated LEFT INGUINAL HERNIA AND UMBILICAL HERNIA  PROCEDURE:  Procedure(s): XI ROBOTIC ASSISTED LEFT INGUINAL HERNIA REPAIR WITH MESH (Left) HERNIA REPAIR UMBILICAL ADULT WITH MESH (N/A)  SURGEON:  Surgeon(s) and Role:    * Ralene Ok, MD - Primary  ASSISTANTS: Pryor Curia, RNFA   ANESTHESIA:   local and general  EBL:  none   BLOOD ADMINISTERED:none  DRAINS: none   LOCAL MEDICATIONS USED:  BUPIVICAINE  and OTHER exparil  SPECIMEN:  No Specimen  DISPOSITION OF SPECIMEN:  N/A  COUNTS:  YES  TOURNIQUET:  * No tourniquets in log *  DICTATION: .Dragon Dictation Details of the procedure:   The patient was taken back to the operating room. The patient was placed in supine position with bilateral SCDs in place.  The patient was prepped and draped in the usual sterile fashion.  After appropriate anitbiotics were confirmed, a time-out was confirmed and all facts were verified.  At this time a Veress needle technique was used to inspect the abdomen approximately 10 cm from the umbilicus and the paramedian line. This time a 8 mm robotic trocar was placed into the abdomen. The camera was placed there was no injury to any intra-abdominal organs. A 54mm umbilical port was placed just superior to the umbilicus. An 8 mm port was placed approximately 10 cm lateral to the umbilicus on the left paramedian side.   Robot was positioned over the patient and the ports were docked in the usual fashion.  At this time the Left-sided peritoneum was taken down from the medial umbilical ligament laterally. The pre-peritoneal space was entered. Dissection was taken down to Cooper's ligament. At this time it was apparent there was a very large indirect hernia.  This contained mainly of preperitoneal fat. This was retracted and with external  pressure it was reduced. At this time proceeded clean out the rest Cooper's ligament and the medial to lateral direction. A proceeded laterally to dissect the spermatic cord. The spermatic cord was circumferentially dissected away from the surrounding musculature and tissue. The vas deferens was identified and protected all portions of the case.   There was some bleeding. This was cauterized.   At this time I proceeded to create a pocket laterally for the mesh. Once the pocket was created the peritoneum was stripped back to approximately the base of the cord. At this time the piece of Progrip 16x12cm mesh was in placed into the dissected area. This covered both the direct and indirect spaces appropriately. This also covered  The femoral space. The mesh lay flat from medial to lateral. At this time a 2 V- lock stitch was used to close the peritoneum in a standard running fashion.  At this time the robot was undocked. The umbilcal stalk was taken off the abdominal wall.  The hernia was palpated and it was approx 1cm.  A medium ST patch mesh was placed and secured using 2-0 prolene.  The hernia fascia was closed using 0 Ethibonds in an interuppted fashion. The umbilical port site was reapproximated using a 0 Vicryl via an Endo Close device 1. All ports were removed. The skin was reapproximated and all port sites using 4-0 Monocryl subcuticular fashion.  The patient the procedure well was taken to the recovery    PLAN OF CARE: Discharge to home after PACU  PATIENT DISPOSITION:  PACU - hemodynamically stable.   Delay start of Pharmacological VTE agent (>24hrs) due to surgical blood loss or risk of bleeding: not applicable

## 2020-07-14 NOTE — Discharge Instructions (Signed)
[facility] _______[facility] ? ?INGUINAL HERNIA REPAIR: POST OP INSTRUCTIONS ? ?Always review your discharge instruction sheet given to you by the facility where your surgery was performed. ?IF YOU HAVE DISABILITY OR FAMILY LEAVE FORMS, YOU MUST BRING THEM TO THE OFFICE FOR PROCESSING.   ?DO NOT GIVE THEM TO YOUR DOCTOR. ? ?1. A  prescription for pain medication Rosko be given to you upon discharge.  Take your pain medication as prescribed, if needed.  If narcotic pain medicine is not needed, then you Ganci take acetaminophen (Tylenol) or ibuprofen (Advil) as needed. ?2. Take your usually prescribed medications unless otherwise directed. ?If you need a refill on your pain medication, please contact your pharmacy.  They will contact our office to request authorization. Prescriptions will not be filled after 5 pm or on week-ends. ?3. You should follow a light diet the first 24 hours after arrival home, such as soup and crackers, etc.  Be sure to include lots of fluids daily.  Resume your normal diet the day after surgery. ?4.Most patients will experience some swelling and bruising around the umbilicus or in the groin and scrotum.  Ice packs and reclining will help.  Swelling and bruising can take several days to resolve.  ?6. It is common to experience some constipation if taking pain medication after surgery.  Increasing fluid intake and taking a stool softener (such as Colace) will usually help or prevent this problem from occurring.  A mild laxative (Milk of Magnesia or Miralax) should be taken according to package directions if there are no bowel movements after 48 hours. ?7. Unless discharge instructions indicate otherwise, you Zenon remove your bandages 24-48 hours after surgery, and you Raska shower at that time.  You Massar have steri-strips (small skin tapes) in place directly over the incision.  These strips should be left on the skin for 7-10 days.  If your surgeon used skin glue on the incision, you Goetzinger  shower in 24 hours.  The glue will flake off over the next 2-3 weeks.  Any sutures or staples will be removed at the office during your follow-up visit. ?8. ACTIVITIES:  You Pagliarulo resume regular (light) daily activities beginning the next day--such as daily self-care, walking, climbing stairs--gradually increasing activities as tolerated.  You Croak have sexual intercourse when it is comfortable.  Refrain from any heavy lifting or straining until approved by your doctor. ? ?a.You Sally drive when you are no longer taking prescription pain medication, you can comfortably wear a seatbelt, and you can safely maneuver your car and apply brakes. ?b.RETURN TO WORK:   ?_____________________________________________ ? ?9.You should see your doctor in the office for a follow-up appointment approximately 2-3 weeks after your surgery.  Make sure that you call for this appointment within a day or two after you arrive home to insure a convenient appointment time. ?10.OTHER INSTRUCTIONS: _________________________ ?   _____________________________________ ? ?WHEN TO CALL YOUR DOCTOR: ?Fever over 101.0 ?Inability to urinate ?Nausea and/or vomiting ?Extreme swelling or bruising ?Continued bleeding from incision. ?Increased pain, redness, or drainage from the incision ? ?The clinic staff is available to answer your questions during regular business hours.  Please don?t hesitate to call and ask to speak to one of the nurses for clinical concerns.  If you have a medical emergency, go to the nearest emergency room or call 911.  A surgeon from [facility] is always on call at the hospital ? ? ?[address], [address], [place], [place]    [address] ? ? [address] [address], [place], [place]   [address] ?[phone] ? [phone] ? FAX [phone] ?Web site: [email or web address] ? ?

## 2020-07-15 ENCOUNTER — Encounter (HOSPITAL_COMMUNITY): Payer: Self-pay | Admitting: General Surgery

## 2020-07-15 NOTE — Anesthesia Postprocedure Evaluation (Signed)
Anesthesia Post Note  Patient: Steven Rogers  Procedure(s) Performed: XI ROBOTIC ASSISTED LEFT INGUINAL HERNIA REPAIR WITH MESH (Left: Abdomen) HERNIA REPAIR UMBILICAL ADULT WITH MESH     Patient location during evaluation: PACU Anesthesia Type: General Level of consciousness: awake and alert Pain management: pain level controlled Vital Signs Assessment: post-procedure vital signs reviewed and stable Respiratory status: spontaneous breathing, nonlabored ventilation and respiratory function stable Cardiovascular status: blood pressure returned to baseline and stable Postop Assessment: no apparent nausea or vomiting Anesthetic complications: no   No notable events documented.  Last Vitals:  Vitals:   07/14/20 1607 07/14/20 1614  BP: 105/75 110/72  Pulse:    Resp:    Temp:  36.4 C  SpO2:  96%    Last Pain:  Vitals:   07/14/20 1630  TempSrc:   PainSc: 3                  Lynda Rainwater

## 2020-11-05 ENCOUNTER — Ambulatory Visit: Payer: Self-pay | Admitting: General Surgery

## 2020-11-11 ENCOUNTER — Other Ambulatory Visit: Payer: Self-pay | Admitting: General Surgery

## 2020-11-11 DIAGNOSIS — K4091 Unilateral inguinal hernia, without obstruction or gangrene, recurrent: Secondary | ICD-10-CM

## 2020-11-17 ENCOUNTER — Ambulatory Visit
Admission: RE | Admit: 2020-11-17 | Discharge: 2020-11-17 | Disposition: A | Discharge status: Home / Self Care | Payer: Medicare PPO | Source: Ambulatory Visit | Attending: General Surgery | Admitting: General Surgery

## 2020-11-17 ENCOUNTER — Other Ambulatory Visit: Payer: Self-pay

## 2020-11-17 DIAGNOSIS — K4091 Unilateral inguinal hernia, without obstruction or gangrene, recurrent: Secondary | ICD-10-CM

## 2020-12-02 ENCOUNTER — Encounter: Payer: Self-pay | Admitting: Dermatology

## 2020-12-02 ENCOUNTER — Other Ambulatory Visit: Payer: Self-pay

## 2020-12-02 ENCOUNTER — Other Ambulatory Visit: Payer: Medicare PPO

## 2020-12-02 ENCOUNTER — Ambulatory Visit: Payer: Medicare PPO | Admitting: Dermatology

## 2020-12-02 DIAGNOSIS — Z1283 Encounter for screening for malignant neoplasm of skin: Secondary | ICD-10-CM | POA: Diagnosis not present

## 2020-12-02 DIAGNOSIS — L738 Other specified follicular disorders: Secondary | ICD-10-CM | POA: Diagnosis not present

## 2020-12-02 DIAGNOSIS — L570 Actinic keratosis: Secondary | ICD-10-CM | POA: Diagnosis not present

## 2020-12-02 DIAGNOSIS — L851 Acquired keratosis [keratoderma] palmaris et plantaris: Secondary | ICD-10-CM

## 2020-12-20 ENCOUNTER — Encounter: Payer: Self-pay | Admitting: Dermatology

## 2020-12-20 NOTE — Progress Notes (Signed)
° °  Follow-Up Visit   Subjective  Steven Rogers is a 70 y.o. male who presents for the following: Annual Exam (No new concerns).  General skin examination, several new crusts Location:  Duration:  Quality:  Associated Signs/Symptoms: Modifying Factors:  Severity:  Timing: Context:   Objective  Well appearing patient in no apparent distress; mood and affect are within normal limits. Left Zygomatic Area, Right Lower Leg - Anterior Several small gritty pink crusts, 2 thicker will be treated with freezing  Mid Forehead Half dozen eccentrically delled flesh-colored two millimeter papules  Right Dorsal Hand Flat subtly textured tan 5 mm  Right Dorsum of Foot Two millimeter white verrucous papules  Torso - Posterior (Back) Full body skin examination: No atypical pigmented lesions are Nonmelanoma skin cancer.    A full examination was performed including scalp, head, eyes, ears, nose, lips, neck, chest, axillae, abdomen, back, buttocks, bilateral upper extremities, bilateral lower extremities, hands, feet, fingers, toes, fingernails, and toenails. All findings within normal limits unless otherwise noted below.   Assessment & Plan    AK (actinic keratosis) (2) Right Lower Leg - Anterior; Left Zygomatic Area  Destruction of lesion - Left Zygomatic Area, Right Lower Leg - Anterior Complexity: simple   Destruction method: cryotherapy   Informed consent: discussed and consent obtained   Timeout:  patient name, date of birth, surgical site, and procedure verified Lesion destroyed using liquid nitrogen: Yes   Cryotherapy cycles:  3 Outcome: patient tolerated procedure well with no complications    Sebaceous hyperplasia of face Mid Forehead  leave if stable but told of similar appearance of early BCC so if there is growth or bleeding return for biopsy  Stucco keratoses Right Dorsal Hand  No intervention necessary  Acrokeratosis Right Dorsum of Foot  No intervention  necessary  Encounter for screening for malignant neoplasm of skin Torso - Posterior (Back)  Annual skin examination.      I, Lavonna Monarch, MD, have reviewed all documentation for this visit.  The documentation on 12/20/20 for the exam, diagnosis, procedures, and orders are all accurate and complete.

## 2021-01-22 ENCOUNTER — Other Ambulatory Visit: Payer: Self-pay | Admitting: Internal Medicine

## 2021-01-25 ENCOUNTER — Other Ambulatory Visit: Payer: Self-pay | Admitting: Internal Medicine

## 2021-01-26 ENCOUNTER — Other Ambulatory Visit: Payer: Self-pay | Admitting: Internal Medicine

## 2021-01-26 DIAGNOSIS — E785 Hyperlipidemia, unspecified: Secondary | ICD-10-CM

## 2021-01-26 DIAGNOSIS — I501 Left ventricular failure: Secondary | ICD-10-CM

## 2021-01-29 NOTE — Progress Notes (Signed)
Surgical Instructions   Your procedure is scheduled on Thursday 02/04/2021.  Report to [facility] Main Entrance "A" at 05:30 A.M., then check in with the Admitting office.  Call [phone] if you have problems or questions between now and the morning of surgery:   Remember: Do not eat after midnight the night before your surgery  You Hlavaty drink clear liquids until 04:30 the morning of your surgery.   Clear liquids allowed are: Water, Non-Citrus Juices (without pulp), Carbonated Beverages, Clear Tea, Black Coffee Only (NO MILK, CREAM, or POWDERED CREAMER of any kind), and Gatorade   Take these medicines the morning of surgery with A SIP OF WATER:  Atorvastatin (Lipitor)   If needed you Graziosi take these medications the morning of surgery: Acetaminophen (Tylenol) Cetirizine (Zyrtec)   As of today, STOP taking any Meloxicam (Mobic); Aspirin (unless otherwise instructed by your surgeon) or Aspirin-containing products; NSAIDS - Aleve, Naproxen, Ibuprofen, Motrin, Advil, Goody's, BC's, all herbal medications, fish oil, and all vitamins.   3 days leading up to your surgery  You are not required to quarantine however you are required to wear a well-fitting mask when you are out and around people not in your household.  If your mask becomes wet or soiled, replace with a new one.  Wash your hands often with soap and water for 20 seconds or clean your hands with an alcohol-based hand sanitizer that contains at least 60% alcohol.  Do not share personal items.  Notify your provider: if you are in close contact with someone who has COVID  or if you develop a fever of 100.4 or greater, sneezing, cough, sore throat, shortness of breath or body aches.          Do not wear jewelry or makeup  Do not wear lotions, powders, perfumes/colognes, or deodorant.  Do not shave 48 hours prior to surgery.  Men Villers shave face and neck.  Do not wear nail polish, gel polish, artificial nails, or any other  type of covering on natural nails including fingernails and toenails. If patients have artificial nails, gel coating, etc. that need to be removed by a nail salon please have this removed prior to surgery or surgery Friddle need to be canceled/delayed if the surgeon/ anesthesia feels like the patient is unable to be adequately monitored.  Do not bring valuables to the hospital - [facility] is not responsible for any belongings or valuables.  Do NOT Smoke (Tobacco/Vaping) or drink Alcohol 24 hours prior to your procedure  If you use a CPAP at night, please bring your mask for your overnight stay.   Contacts, glasses, hearing aids, dentures or partials Labarbera not be worn into surgery, please bring cases for these belongings   For patients admitted to the hospital, discharge time will be determined by your treatment team.   Patients discharged the day of surgery will not be allowed to drive home, and someone needs to stay with them for 24 hours.  NO VISITORS WILL BE ALLOWED IN PRE-OP WHERE PATIENTS ARE PREPPED FOR SURGERY.  ONLY 1 SUPPORT PERSON Brophy BE PRESENT IN THE WAITING ROOM WHILE YOU ARE IN SURGERY.  IF YOU ARE TO BE ADMITTED, ONCE YOU ARE IN YOUR ROOM YOU WILL BE ALLOWED TWO (2) VISITORS. 1 (ONE) VISITOR Fussell STAY OVERNIGHT BUT MUST ARRIVE TO THE ROOM BY 8pm.  Minor children Culliver have two parents present. Special consideration for safety and communication needs will be reviewed on a case by case basis.  Special instructions:    Oral Hygiene is also important to reduce your risk of infection.  Remember - BRUSH YOUR TEETH THE MORNING OF SURGERY WITH YOUR REGULAR TOOTHPASTE   [facility]- Preparing For Surgery  Before surgery, you can play an important role. Because skin is not sterile, your skin needs to be as free of germs as possible. You can reduce the number of germs on your skin by washing with CHG (chlorahexidine gluconate) Soap before surgery.  CHG is an antiseptic cleaner which kills germs  and bonds with the skin to continue killing germs even after washing.     Please do not use if you have an allergy to CHG or antibacterial soaps. If your skin becomes reddened/irritated stop using the CHG.  Do not shave (including legs and underarms) for at least 48 hours prior to first CHG shower. It is OK to shave your face.  Please follow these instructions carefully.     Shower the NIGHT BEFORE SURGERY and the MORNING OF SURGERY with CHG Soap.   If you chose to wash your hair, wash your hair first as usual with your normal shampoo. After you shampoo, rinse your hair and body thoroughly to remove the shampoo.    Then [employer] and genitals (private parts) with your normal soap and rinse thoroughly to remove soap.  Next use the CHG Soap as you would any other liquid soap. You can apply CHG directly to the skin and wash gently with a clean washcloth.   Apply the CHG Soap to your body ONLY FROM THE NECK DOWN.  Do not use on open wounds or open sores. Avoid contact with your eyes, ears, mouth and genitals (private parts). Wash Face and genitals (private parts)  with your normal soap.   Wash thoroughly, paying special attention to the area where your surgery will be performed.  Thoroughly rinse your body with warm water from the neck down.  DO NOT shower/wash with your normal soap after using and rinsing off the CHG Soap.  Pat yourself dry with a CLEAN TOWEL.  Wear CLEAN PAJAMAS to bed the night before surgery  Place CLEAN SHEETS on your bed the night before your surgery  DO NOT SLEEP WITH PETS.   Day of Surgery:  Take a shower with CHG soap. Wear Clean/Comfortable clothing the morning of surgery Do not apply any deodorants/lotions.   Remember to brush your teeth WITH YOUR REGULAR TOOTHPASTE.   Please read over the fact sheets that you were given.

## 2021-02-01 ENCOUNTER — Other Ambulatory Visit: Payer: Self-pay

## 2021-02-01 ENCOUNTER — Encounter (HOSPITAL_COMMUNITY)
Admission: RE | Admit: 2021-02-01 | Discharge: 2021-02-01 | Disposition: A | Discharge status: Home / Self Care | Payer: Medicare PPO | Source: Ambulatory Visit | Attending: General Surgery | Admitting: General Surgery

## 2021-02-01 ENCOUNTER — Encounter (HOSPITAL_COMMUNITY): Payer: Self-pay

## 2021-02-01 ENCOUNTER — Ambulatory Visit
Admission: RE | Admit: 2021-02-01 | Discharge: 2021-02-01 | Disposition: A | Discharge status: Home / Self Care | Payer: Medicare PPO | Source: Ambulatory Visit | Attending: Internal Medicine | Admitting: Internal Medicine

## 2021-02-01 ENCOUNTER — Ambulatory Visit: Payer: Self-pay | Admitting: General Surgery

## 2021-02-01 VITALS — BP 127/87 | HR 85 | Temp 97.5°F | Resp 18 | Ht 72.0 in | Wt 227.7 lb

## 2021-02-01 DIAGNOSIS — Z01818 Encounter for other preprocedural examination: Secondary | ICD-10-CM

## 2021-02-01 DIAGNOSIS — E785 Hyperlipidemia, unspecified: Secondary | ICD-10-CM

## 2021-02-01 DIAGNOSIS — Z01812 Encounter for preprocedural laboratory examination: Secondary | ICD-10-CM | POA: Diagnosis present

## 2021-02-01 DIAGNOSIS — I501 Left ventricular failure: Secondary | ICD-10-CM

## 2021-02-01 LAB — CBC
HCT: 44.3 % (ref 39.0–52.0)
Hemoglobin: 15.2 g/dL (ref 13.0–17.0)
MCH: 34.4 pg — ABNORMAL HIGH (ref 26.0–34.0)
MCHC: 34.3 g/dL (ref 30.0–36.0)
MCV: 100.2 fL — ABNORMAL HIGH (ref 80.0–100.0)
Platelets: 171 10*3/uL (ref 150–400)
RBC: 4.42 MIL/uL (ref 4.22–5.81)
RDW: 13.4 % (ref 11.5–15.5)
WBC: 5.7 10*3/uL (ref 4.0–10.5)
nRBC: 0 % (ref 0.0–0.2)

## 2021-02-01 NOTE — Progress Notes (Signed)
PCP: Dr. Lorene Dy Cardiologist: denies  EKG: n/a CXR: n/a ECHO: denies Stress Test: denies Cardiac Cath: denies  No Orders: ERAS clears per protocol  Covid test: ambulatory, not needed per protocol  Patient denies shortness of breath, fever, cough, and chest pain at PAT appointment.  Patient verbalized understanding of instructions provided today at the PAT appointment.  Patient asked to review instructions at home and day of surgery.

## 2021-02-04 ENCOUNTER — Ambulatory Visit (HOSPITAL_COMMUNITY)
Admission: RE | Admit: 2021-02-04 | Discharge: 2021-02-04 | Disposition: A | Discharge status: Home / Self Care | Payer: Medicare PPO | Attending: General Surgery | Admitting: General Surgery

## 2021-02-04 ENCOUNTER — Encounter (HOSPITAL_COMMUNITY): Payer: Self-pay | Admitting: General Surgery

## 2021-02-04 ENCOUNTER — Ambulatory Visit (HOSPITAL_COMMUNITY): Payer: Medicare PPO | Admitting: Anesthesiology

## 2021-02-04 ENCOUNTER — Other Ambulatory Visit: Payer: Self-pay

## 2021-02-04 ENCOUNTER — Encounter (HOSPITAL_COMMUNITY)
Admission: RE | Disposition: A | Discharge status: Home / Self Care | Payer: Self-pay | Source: Home / Self Care | Attending: General Surgery

## 2021-02-04 DIAGNOSIS — Z6829 Body mass index (BMI) 29.0-29.9, adult: Secondary | ICD-10-CM | POA: Diagnosis not present

## 2021-02-04 DIAGNOSIS — E669 Obesity, unspecified: Secondary | ICD-10-CM | POA: Insufficient documentation

## 2021-02-04 DIAGNOSIS — K4091 Unilateral inguinal hernia, without obstruction or gangrene, recurrent: Secondary | ICD-10-CM | POA: Insufficient documentation

## 2021-02-04 HISTORY — PX: INGUINAL HERNIA REPAIR: SHX194

## 2021-02-04 SURGERY — REPAIR, HERNIA, INGUINAL, ADULT
Anesthesia: General | Laterality: Left

## 2021-02-04 MED ORDER — CHLORHEXIDINE GLUCONATE CLOTH 2 % EX PADS: 6.0000 | MEDICATED_PAD | Freq: Once | CUTANEOUS | Status: DC

## 2021-02-04 MED ORDER — BUPIVACAINE-EPINEPHRINE (PF) 0.5% -1:200000 IJ SOLN
INTRAMUSCULAR | Status: DC | PRN
  Administered 2021-02-04: 25 mL via PERINEURAL

## 2021-02-04 MED ORDER — BUPIVACAINE-EPINEPHRINE (PF) 0.25% -1:200000 IJ SOLN
INTRAMUSCULAR | Status: AC
  Filled 2021-02-04: qty 60

## 2021-02-04 MED ORDER — ONDANSETRON HCL 4 MG/2ML IJ SOLN
INTRAMUSCULAR | Status: DC | PRN
  Administered 2021-02-04: 4 mg via INTRAVENOUS

## 2021-02-04 MED ORDER — DEXAMETHASONE SODIUM PHOSPHATE 10 MG/ML IJ SOLN
INTRAMUSCULAR | Status: DC | PRN
  Administered 2021-02-04: 5 mg via INTRAVENOUS

## 2021-02-04 MED ORDER — CHLORHEXIDINE GLUCONATE 0.12 % MT SOLN
15.0000 mL | Freq: Once | OROMUCOSAL | Status: AC
  Administered 2021-02-04: 15 mL via OROMUCOSAL
  Filled 2021-02-04: qty 15

## 2021-02-04 MED ORDER — LIDOCAINE 2% (20 MG/ML) 5 ML SYRINGE
INTRAMUSCULAR | Status: DC | PRN
  Administered 2021-02-04: 50 mg via INTRAVENOUS

## 2021-02-04 MED ORDER — ACETAMINOPHEN 500 MG PO TABS
1000.0000 mg | ORAL_TABLET | ORAL | Status: AC
  Administered 2021-02-04: 1000 mg via ORAL
  Filled 2021-02-04: qty 2

## 2021-02-04 MED ORDER — CEFAZOLIN SODIUM-DEXTROSE 2-4 GM/100ML-% IV SOLN
2.0000 g | INTRAVENOUS | Status: AC
  Administered 2021-02-04: 2 g via INTRAVENOUS
  Filled 2021-02-04: qty 100

## 2021-02-04 MED ORDER — TRAMADOL HCL 50 MG PO TABS: 50.0000 mg | ORAL_TABLET | Freq: Four times a day (QID) | ORAL | 0 refills | Status: AC | PRN

## 2021-02-04 MED ORDER — ORAL CARE MOUTH RINSE: 15.0000 mL | Freq: Once | OROMUCOSAL | Status: AC

## 2021-02-04 MED ORDER — LIDOCAINE 2% (20 MG/ML) 5 ML SYRINGE
INTRAMUSCULAR | Status: AC
  Filled 2021-02-04: qty 5

## 2021-02-04 MED ORDER — BUPIVACAINE-EPINEPHRINE 0.25% -1:200000 IJ SOLN
INTRAMUSCULAR | Status: DC | PRN
  Administered 2021-02-04: 6 mL

## 2021-02-04 MED ORDER — LACTATED RINGERS IV SOLN: INTRAVENOUS | Status: DC

## 2021-02-04 MED ORDER — SUGAMMADEX SODIUM 200 MG/2ML IV SOLN
INTRAVENOUS | Status: DC | PRN
Start: 2021-02-04 — End: 2021-02-04
  Administered 2021-02-04: 200 mg via INTRAVENOUS

## 2021-02-04 MED ORDER — MIDAZOLAM HCL 2 MG/2ML IJ SOLN
INTRAMUSCULAR | Status: AC
  Filled 2021-02-04: qty 2

## 2021-02-04 MED ORDER — 0.9 % SODIUM CHLORIDE (POUR BTL) OPTIME
TOPICAL | Status: DC | PRN
  Administered 2021-02-04: 1000 mL

## 2021-02-04 MED ORDER — FENTANYL CITRATE (PF) 100 MCG/2ML IJ SOLN
INTRAMUSCULAR | Status: DC | PRN
Start: 2021-02-04 — End: 2021-02-04
  Administered 2021-02-04 (×2): 50 ug via INTRAVENOUS
  Administered 2021-02-04: 100 ug via INTRAVENOUS

## 2021-02-04 MED ORDER — PROPOFOL 10 MG/ML IV BOLUS
INTRAVENOUS | Status: AC
  Filled 2021-02-04: qty 20

## 2021-02-04 MED ORDER — MIDAZOLAM HCL 5 MG/5ML IJ SOLN
INTRAMUSCULAR | Status: DC | PRN
  Administered 2021-02-04: 2 mg via INTRAVENOUS

## 2021-02-04 MED ORDER — LIDOCAINE HCL (PF) 2 % IJ SOLN
INTRAMUSCULAR | Status: DC | PRN
  Administered 2021-02-04: 100 mg via PERINEURAL

## 2021-02-04 MED ORDER — ROCURONIUM BROMIDE 100 MG/10ML IV SOLN
INTRAVENOUS | Status: DC | PRN
Start: 2021-02-04 — End: 2021-02-04
  Administered 2021-02-04: 60 mg via INTRAVENOUS
  Administered 2021-02-04: 10 mg via INTRAVENOUS

## 2021-02-04 MED ORDER — FENTANYL CITRATE (PF) 250 MCG/5ML IJ SOLN
INTRAMUSCULAR | Status: AC
  Filled 2021-02-04: qty 5

## 2021-02-04 MED ORDER — ROCURONIUM BROMIDE 10 MG/ML (PF) SYRINGE
PREFILLED_SYRINGE | INTRAVENOUS | Status: AC
  Filled 2021-02-04: qty 10

## 2021-02-04 MED ORDER — ENSURE PRE-SURGERY PO LIQD: 296.0000 mL | Freq: Once | ORAL | Status: DC

## 2021-02-04 MED ORDER — PROPOFOL 10 MG/ML IV BOLUS
INTRAVENOUS | Status: DC | PRN
  Administered 2021-02-04: 20 mg via INTRAVENOUS
  Administered 2021-02-04: 150 mg via INTRAVENOUS

## 2021-02-04 SURGICAL SUPPLY — 49 items
ADH SKN CLS APL DERMABOND .7 (GAUZE/BANDAGES/DRESSINGS) ×1
APL PRP STRL LF DISP 70% ISPRP (MISCELLANEOUS) ×1
BAG COUNTER SPONGE SURGICOUNT (BAG) ×2 IMPLANT
BAG SPNG CNTER NS LX DISP (BAG) ×1
BLADE CLIPPER SURG (BLADE) ×1 IMPLANT
CANISTER SUCT 3000ML PPV (MISCELLANEOUS) ×1 IMPLANT
CHLORAPREP W/TINT 26 (MISCELLANEOUS) ×2 IMPLANT
COVER SURGICAL LIGHT HANDLE (MISCELLANEOUS) ×2 IMPLANT
DERMABOND ADVANCED (GAUZE/BANDAGES/DRESSINGS) ×1
DERMABOND ADVANCED .7 DNX12 (GAUZE/BANDAGES/DRESSINGS) ×1 IMPLANT
DRAIN PENROSE 1/2X12 LTX STRL (WOUND CARE) ×1 IMPLANT
DRAPE LAPAROSCOPIC ABDOMINAL (DRAPES) ×2 IMPLANT
ELECT REM PT RETURN 9FT ADLT (ELECTROSURGICAL) ×2
ELECTRODE REM PT RTRN 9FT ADLT (ELECTROSURGICAL) ×1 IMPLANT
GAUZE 4X4 16PLY ~~LOC~~+RFID DBL (SPONGE) ×2 IMPLANT
GLOVE SRG 8 PF TXTR STRL LF DI (GLOVE) ×1 IMPLANT
GLOVE SURG SYN 7.5  E (GLOVE) ×2
GLOVE SURG SYN 7.5 E (GLOVE) ×1 IMPLANT
GLOVE SURG SYN 7.5 PF PI (GLOVE) ×1 IMPLANT
GLOVE SURG UNDER POLY LF SZ8 (GLOVE) ×2
GOWN STRL REUS W/ TWL LRG LVL3 (GOWN DISPOSABLE) ×1 IMPLANT
GOWN STRL REUS W/ TWL XL LVL3 (GOWN DISPOSABLE) ×1 IMPLANT
GOWN STRL REUS W/TWL LRG LVL3 (GOWN DISPOSABLE) ×2
GOWN STRL REUS W/TWL XL LVL3 (GOWN DISPOSABLE) ×2
KIT BASIN OR (CUSTOM PROCEDURE TRAY) ×2 IMPLANT
KIT TURNOVER KIT B (KITS) ×2 IMPLANT
MESH PARIETEX PROGRIP LEFT (Mesh General) ×1 IMPLANT
NDL HYPO 25GX1X1/2 BEV (NEEDLE) ×1 IMPLANT
NEEDLE HYPO 25GX1X1/2 BEV (NEEDLE) ×2 IMPLANT
NS IRRIG 1000ML POUR BTL (IV SOLUTION) ×2 IMPLANT
PACK GENERAL/GYN (CUSTOM PROCEDURE TRAY) ×2 IMPLANT
PAD ARMBOARD 7.5X6 YLW CONV (MISCELLANEOUS) ×4 IMPLANT
PENCIL SMOKE EVACUATOR (MISCELLANEOUS) ×2 IMPLANT
SPONGE INTESTINAL PEANUT (DISPOSABLE) IMPLANT
SUT MNCRL AB 4-0 PS2 18 (SUTURE) ×2 IMPLANT
SUT PROLENE 2 0 SH DA (SUTURE) ×2 IMPLANT
SUT SILK 0 SH 30 (SUTURE) ×3 IMPLANT
SUT SILK 0 TIES 10X30 (SUTURE) ×2 IMPLANT
SUT VIC AB 2-0 SH 27 (SUTURE) ×2
SUT VIC AB 2-0 SH 27X BRD (SUTURE) ×1 IMPLANT
SUT VIC AB 3-0 SH 27 (SUTURE) ×2
SUT VIC AB 3-0 SH 27XBRD (SUTURE) ×1 IMPLANT
SUT VICRYL AB 2 0 TIES (SUTURE) ×2 IMPLANT
SYR CONTROL 10ML LL (SYRINGE) ×2 IMPLANT
TOWEL GREEN STERILE (TOWEL DISPOSABLE) ×2 IMPLANT
TOWEL GREEN STERILE FF (TOWEL DISPOSABLE) ×2 IMPLANT
TRAY FOL W/BAG SLVR 16FR STRL (SET/KITS/TRAYS/PACK) ×1 IMPLANT
TRAY FOLEY W/BAG SLVR 16FR LF (SET/KITS/TRAYS/PACK) ×2
WATER STERILE IRR 1000ML POUR (IV SOLUTION) ×1 IMPLANT

## 2021-02-04 NOTE — Discharge Instructions (Signed)
[facility] _______[facility] ? ?INGUINAL HERNIA REPAIR: POST OP INSTRUCTIONS ? ?Always review your discharge instruction sheet given to you by the facility where your surgery was performed. ?IF YOU HAVE DISABILITY OR FAMILY LEAVE FORMS, YOU MUST BRING THEM TO THE OFFICE FOR PROCESSING.   ?DO NOT GIVE THEM TO YOUR DOCTOR. ? ?1. A  prescription for pain medication Gauthier be given to you upon discharge.  Take your pain medication as prescribed, if needed.  If narcotic pain medicine is not needed, then you Tinnell take acetaminophen (Tylenol) or ibuprofen (Advil) as needed. ?2. Take your usually prescribed medications unless otherwise directed. ?If you need a refill on your pain medication, please contact your pharmacy.  They will contact our office to request authorization. Prescriptions will not be filled after 5 pm or on week-ends. ?3. You should follow a light diet the first 24 hours after arrival home, such as soup and crackers, etc.  Be sure to include lots of fluids daily.  Resume your normal diet the day after surgery. ?4.Most patients will experience some swelling and bruising around the umbilicus or in the groin and scrotum.  Ice packs and reclining will help.  Swelling and bruising can take several days to resolve.  ?6. It is common to experience some constipation if taking pain medication after surgery.  Increasing fluid intake and taking a stool softener (such as Colace) will usually help or prevent this problem from occurring.  A mild laxative (Milk of Magnesia or Miralax) should be taken according to package directions if there are no bowel movements after 48 hours. ?7. Unless discharge instructions indicate otherwise, you Secrest remove your bandages 24-48 hours after surgery, and you Alpern shower at that time.  You Chipman have steri-strips (small skin tapes) in place directly over the incision.  These strips should be left on the skin for 7-10 days.  If your surgeon used skin glue on the incision, you Alberta  shower in 24 hours.  The glue will flake off over the next 2-3 weeks.  Any sutures or staples will be removed at the office during your follow-up visit. ?8. ACTIVITIES:  You Mallon resume regular (light) daily activities beginning the next day--such as daily self-care, walking, climbing stairs--gradually increasing activities as tolerated.  You Manders have sexual intercourse when it is comfortable.  Refrain from any heavy lifting or straining until approved by your doctor. ? ?a.You Gorum drive when you are no longer taking prescription pain medication, you can comfortably wear a seatbelt, and you can safely maneuver your car and apply brakes. ?b.RETURN TO WORK:   ?_____________________________________________ ? ?9.You should see your doctor in the office for a follow-up appointment approximately 2-3 weeks after your surgery.  Make sure that you call for this appointment within a day or two after you arrive home to insure a convenient appointment time. ?10.OTHER INSTRUCTIONS: _________________________ ?   _____________________________________ ? ?WHEN TO CALL YOUR DOCTOR: ?Fever over 101.0 ?Inability to urinate ?Nausea and/or vomiting ?Extreme swelling or bruising ?Continued bleeding from incision. ?Increased pain, redness, or drainage from the incision ? ?The clinic staff is available to answer your questions during regular business hours.  Please don?t hesitate to call and ask to speak to one of the nurses for clinical concerns.  If you have a medical emergency, go to the nearest emergency room or call 911.  A surgeon from [facility] is always on call at the hospital ? ? ?[address], [address], [place], [place]    [address] ? ? [address] [address], , [place]   [address] ?[phone] ? [phone] ? FAX [phone] ?Web site: [email or web address] ? ?

## 2021-02-04 NOTE — Anesthesia Postprocedure Evaluation (Signed)
Anesthesia Post Note  Patient: [name]  Procedure(s) Performed: OPEN LEFT INGUINAL HERNIA REPAIR WITH MESH (Left)     Patient location during evaluation: PACU Anesthesia Type: General and Regional Level of consciousness: awake and alert Pain management: pain level controlled Vital Signs Assessment: post-procedure vital signs reviewed and stable Respiratory status: spontaneous breathing, nonlabored ventilation, respiratory function stable and patient connected to nasal cannula oxygen Cardiovascular status: blood pressure returned to baseline and stable Postop Assessment: no apparent nausea or vomiting Anesthetic complications: no   No notable events documented.  Last Vitals:  Vitals:   02/04/21 0915 02/04/21 0930  BP: 135/85 129/89  Pulse: 79 78  Resp: 11 11  Temp:  36.9 C  SpO2: 94% 96%    Last Pain:  Vitals:   02/04/21 0930  TempSrc:   PainSc: 0-No pain                 Beula Joyner

## 2021-02-04 NOTE — Anesthesia Procedure Notes (Signed)
Procedure Name: Intubation Date/Time: 02/04/2021 7:29 AM Performed by: Gwyndolyn Saxon, CRNA Pre-anesthesia Checklist: Patient identified, Emergency Drugs available, Suction available and Patient being monitored Patient Re-evaluated:Patient Re-evaluated prior to induction Oxygen Delivery Method: Circle system utilized Preoxygenation: Pre-oxygenation with 100% oxygen Induction Type: IV induction Ventilation: Mask ventilation without difficulty Laryngoscope Size: Miller and 2 Grade View: Grade I Tube type: Oral Tube size: 7.5 mm Number of attempts: 1 Airway Equipment and Method: Patient positioned with wedge pillow and Stylet Placement Confirmation: ETT inserted through vocal cords under direct vision, positive ETCO2 and breath sounds checked- equal and bilateral Secured at: 23 cm Tube secured with: Tape Dental Injury: Teeth and Oropharynx as per pre-operative assessment

## 2021-02-04 NOTE — Transfer of Care (Signed)
Immediate Anesthesia Transfer of Care Note  Patient: Steven Rogers  Procedure(s) Performed: OPEN LEFT INGUINAL HERNIA REPAIR WITH MESH (Left)  Patient Location: PACU  Anesthesia Type:General and Regional  Level of Consciousness: awake, alert  and oriented  Airway & Oxygen Therapy: Patient Spontanous Breathing  Post-op Assessment: Report given to RN and Post -op Vital signs reviewed and stable  Post vital signs: Reviewed and stable  Last Vitals:  Vitals Value Taken Time  BP 135/94 02/04/21 0900  Temp 36.9 C 02/04/21 0900  Pulse 89 02/04/21 0902  Resp 13 02/04/21 0902  SpO2 95 % 02/04/21 0902  Vitals shown include unvalidated device data.  Last Pain:  Vitals:   02/04/21 0618  TempSrc:   PainSc: 0-No pain         Complications: No notable events documented.

## 2021-02-04 NOTE — Anesthesia Preprocedure Evaluation (Addendum)
Anesthesia Evaluation  Patient identified by MRN, date of birth, ID band Patient awake    Reviewed: Allergy & Precautions, NPO status , Patient's Chart, lab work & pertinent test results  Airway Mallampati: II  TM Distance: >3 FB Neck ROM: Full    Dental  (+) Teeth Intact, Dental Advisory Given   Pulmonary neg pulmonary ROS,    breath sounds clear to auscultation       Cardiovascular negative cardio ROS   Rhythm:Regular Rate:Normal     Neuro/Psych negative neurological ROS     GI/Hepatic Neg liver ROS, Inguinal and umbilical hernia    Endo/Other  Obesity   Renal/GU negative Renal ROS     Musculoskeletal negative musculoskeletal ROS (+)   Abdominal   Peds  Hematology negative hematology ROS (+)   Anesthesia Other Findings Day of surgery medications reviewed with the patient.  Reproductive/Obstetrics                             Anesthesia Physical Anesthesia Plan  ASA: 1  Anesthesia Plan: General   Post-op Pain Management: Regional block   Induction: Intravenous  PONV Risk Score and Plan: 2 and Ondansetron and Dexamethasone  Airway Management Planned: Oral ETT  Additional Equipment: None  Intra-op Plan:   Post-operative Plan: Extubation in OR  Informed Consent: I have reviewed the patients History and Physical, chart, labs and discussed the procedure including the risks, benefits and alternatives for the proposed anesthesia with the patient or authorized representative who has indicated his/her understanding and acceptance.     Dental advisory given  Plan Discussed with: CRNA and Anesthesiologist  Anesthesia Plan Comments:         Anesthesia Quick Evaluation

## 2021-02-04 NOTE — Anesthesia Procedure Notes (Signed)
Anesthesia Regional Block: TAP block   Pre-Anesthetic Checklist: , timeout performed,  Correct Patient, Correct Site, Correct Laterality,  Correct Procedure, Correct Position, site marked,  Risks and benefits discussed,  Surgical consent,  Pre-op evaluation,  At surgeon's request and post-op pain management  Laterality: Left  Prep: chloraprep       Needles:  Injection technique: Single-shot      Needle Length: 9cm  Needle Gauge: 22     Additional Needles: Arrow StimuQuik ECHO Echogenic Stimulating PNB Needle  Procedures:,,,, ultrasound used (permanent image in chart),,    Narrative:  Start time: 02/04/2021 7:28 AM End time: 02/04/2021 7:33 AM Injection made incrementally with aspirations every 5 mL.  Performed by: Personally  Anesthesiologist: Oleta Mouse, MD

## 2021-02-04 NOTE — H&P (Signed)
Chief Complaint: Long Term Follow Up (Left Ing. Hernia repair)   History of Present Illness: Steven Rogers is a 71 y.o. male who is seen today as an office consultation at the request of Dr. Pasty Arch for evaluation of Long Term Follow Up (Left Ing. Hernia repair) .  Patient is a 71 year old male who previously underwent a robotic left inguinal hernia repair with mesh in July 2022. This was a large inguinal scrotal hernia that was repaired with a 16 x 12 cm piece of ProGrip mesh.  Patient states that he has noticed that the bulge is slowly gotten larger the left inguinal area. He does feel pressure in that area and feels the same as it was previous to the repair.  Patient's had no signs or symptoms of incarceration or strangulation.  Review of Systems: A complete review of systems was obtained from the patient. I have reviewed this information and discussed as appropriate with the patient. See HPI as well for other ROS.  Review of Systems  Constitutional: Negative for fever.  HENT: Negative for congestion.  Eyes: Negative for blurred vision.  Respiratory: Negative for cough, shortness of breath and wheezing.  Cardiovascular: Negative for chest pain and palpitations.  Gastrointestinal: Negative for heartburn.  Genitourinary: Negative for dysuria.  Musculoskeletal: Negative for myalgias.  Skin: Negative for rash.  Neurological: Negative for dizziness and headaches.  Psychiatric/Behavioral: Negative for depression and suicidal ideas.  All other systems reviewed and are negative.   Medical History: History reviewed. No pertinent past medical history.  There is no problem list on file for this patient.  Past Surgical History:  Procedure Laterality Date   HERNIA REPAIR    No Known Allergies  Current Outpatient Medications on File Prior to Visit  Medication Sig Dispense Refill   atorvastatin (LIPITOR) 20 MG tablet Take 20 mg by mouth once daily   cetirizine (ZYRTEC) 10 MG tablet Take  by mouth   multivitamin with minerals tablet Take 1 tablet by mouth once daily   No current facility-administered medications on file prior to visit.   Family History  Problem Relation Age of Onset   Coronary Artery Disease (Blocked arteries around heart) Father   High blood pressure (Hypertension) Brother   Hyperlipidemia (Elevated cholesterol) Brother   Coronary Artery Disease (Blocked arteries around heart) Brother   Diabetes Brother    Social History   Tobacco Use  Smoking Status Never Smoker  Smokeless Tobacco Never Used    Social History   Socioeconomic History   Marital status: Married  Tobacco Use   Smoking status: Never Smoker   Smokeless tobacco: Never Used  Scientific laboratory technician Use: Never used  Substance and Sexual Activity   Alcohol use: Yes   Drug use: Never   Objective:   Vitals:  11/05/20 1520  Weight: 100.4 kg (221 lb 6.4 oz)  Height: 182.9 cm (6')   Body mass index is 30.03 kg/m. Physical Exam Constitutional:  General: He is not in acute distress. Appearance: Normal appearance.  HENT:  Head: Normocephalic.  Nose: No rhinorrhea.  Mouth/Throat:  Mouth: Mucous membranes are moist.  Pharynx: Oropharynx is clear.  Eyes:  General: No scleral icterus. Pupils: Pupils are equal, round, and reactive to light.  Cardiovascular:  Rate and Rhythm: Normal rate.  Pulses: Normal pulses.  Pulmonary:  Effort: Pulmonary effort is normal. No respiratory distress.  Breath sounds: No stridor. No wheezing.  Abdominal:  General: Abdomen is flat. There is no distension.  Tenderness: There is  no abdominal tenderness. There is no guarding or rebound.  Hernia: A hernia is present. Hernia is present in the left inguinal area (moves with valsalva).  Musculoskeletal:  General: Normal range of motion.  Cervical back: Normal range of motion and neck supple.  Skin: General: Skin is warm and dry.  Capillary Refill: Capillary refill takes less than 2 seconds.   Coloration: Skin is not jaundiced.  Neurological:  General: No focal deficit present.  Mental Status: He is alert and oriented to person, place, and time. Mental status is at baseline.  Psychiatric:  Mood and Affect: Mood normal.  Thought Content: Thought content normal.  Judgment: Judgment normal.     Assessment and Plan:  Diagnoses and all orders for this visit:  Recurrent left inguinal hernia   Steven Rogers is a 71 y.o. male   1. To OR for open LIHR with mesh All risks and benefits were discussed with the patient, to generally include infection, bleeding, damage to surrounding structures, acute and chronic nerve pain, and recurrence. Alternatives were offered and described.  All questions were answered and the patient voiced understanding of the procedure and wishes to proceed at this point.   No follow-ups on file.  Ralene Ok, MD, [facility] Surgery, [facility]

## 2021-02-04 NOTE — Op Note (Signed)
Procedure Note  Steven Rogers 02/04/2021  Pre-op Diagnosis: RECURRENT LEFT INGUINAL HERNIA     Post-op Diagnosis: RECURRENT LEFT INGUINAL HERNIA, Indirect  Procedure(s): OPEN LEFT INGUINAL HERNIA REPAIR WITH MESH  Surgeon(s): Ralene Ok, MD Yvone Neu MD  Anesthesia: General  Staff:  Circulator: Colon Flattery, RN Scrub Person: Rolan Bucco Circulator Assistant: Rometta Emery, RN  Details of the procedure: The patient was taken back to the operating room. The patient was placed in supine position with bilateral SCDs in place. The patient was prepped and draped in the usual sterile fashion.  After appropriate anitbiotics were confirmed, a time-out was confirmed and all facts were verified.  Quarter percent Marcaine was used to infiltrate the area of the incision and an TAP nerve block was also placed by the anesthesia team.   A 5 cm incision was made just 1 cm superior to the inguinal ligament. Bovie cautery was used to maintain hemostasis dissection is carried down to the external oblique.  A standard incision was made laterally, and the external oblique was bluntly dissected away from the surrounding tissue with Metzenbaum scissors. The external oblique was elevated in the spermatic cord was bluntly dissected away from the surrounding tissue.    The spermatic cord and the hernia were then bluntly dissected away from the pubic tubercle and a Penrose was placed around the hernia sac in the spermatic cord. The vas deferens was identified and protected at all portions of the case. Dissection of the cremasterics took place with Bovie cautery. There was a very large piece of retroperitoneal fat adjacent to the hernia sac. One the hernia sac was dissected away from the surrrounding cremesteric tissue , the hernia sac was entered laterally. A kelly clamp was used to come across the base and the sac was highly ligated using 0 Vicryl.  This retracted into the abdomen in the usual  fashion. A small amount of bleeding at the base was controlled with an additional suture ligation. At this time a left-sided Progrip mesh was then anchored to the pubic tubercle with a 2-0 Prolene.  It was anchored to the shelving edge of the external oblique x 1 and the conjoint tendon cephalad x 2.  The wrap around of the mesh was sutured to the conjoint tendon as well.  The new internal ring did not strangulate the spermatic cord.   The tail was then tucked under the external oblique. At this time the area was irrigated out with sterile saline.    The external oblique was reapproximated using a 2-0 Vicryl in a running fashion. Scarpa's fascia was then reapproximated using a 3-0 Vicryl running fashion. The skin was then reapproximated with 4 Monocryl in a subcuticular fashion. The skin was then dressed with Dermabond.  The patient was taken to the recovery room in stable condition.  Estimated Blood Loss: less than 50 mL               Specimens: None         Yvone Neu   Date: 02/04/2021  Time: 8:52 AM

## 2021-02-05 ENCOUNTER — Encounter (HOSPITAL_COMMUNITY): Payer: Self-pay | Admitting: General Surgery

## 2021-02-16 ENCOUNTER — Encounter (HOSPITAL_COMMUNITY): Payer: Self-pay | Admitting: General Surgery

## 2021-07-14 ENCOUNTER — Encounter: Payer: Self-pay | Admitting: Dermatology

## 2021-07-14 ENCOUNTER — Ambulatory Visit: Payer: Medicare PPO | Admitting: Dermatology

## 2021-07-14 DIAGNOSIS — L820 Inflamed seborrheic keratosis: Secondary | ICD-10-CM

## 2021-07-14 DIAGNOSIS — D485 Neoplasm of uncertain behavior of skin: Secondary | ICD-10-CM

## 2021-07-14 NOTE — Patient Instructions (Signed)
Biopsy, Surgery (Curettage) & Surgery (Excision) Aftercare Instructions ? ?1. Okay to remove bandage in 24 hours ? ?2. Wash area with soap and water ? ?3. Apply Vaseline to area twice daily until healed (Not Neosporin) ? ?4. Okay to cover with a Band-Aid to decrease the chance of infection or prevent irritation from clothing; also it's okay to uncover lesion at home. ? ?5. Suture instructions: return to our office in 7-10 or 10-14 days for a nurse visit for suture removal. Variable healing with sutures, if pain or itching occurs call our office. It's okay to shower or bathe 24 hours after sutures are given. ? ?6. The following risks Vitelli occur after a biopsy, curettage or excision: bleeding, scarring, discoloration, recurrence, infection (redness, yellow drainage, pain or swelling). ? ?7. For questions, concerns and results call our office at Monday-Thursday before 4pm & Friday before 3pm. Biopsy results will be available in 1 week. ? ?

## 2021-08-06 ENCOUNTER — Encounter: Payer: Self-pay | Admitting: Dermatology

## 2021-08-06 NOTE — Progress Notes (Signed)
   Follow-Up Visit   Subjective  Steven Rogers is a 71 y.o. male who presents for the following: Follow-up (Left post knee dark lesion new per wife ).  Wife has noticed a dark spot on leg Location:  Duration:  Quality:  Associated Signs/Symptoms: Modifying Factors:  Severity:  Timing: Context:   Objective  Well appearing patient in no apparent distress; mood and affect are within normal limits. Left Popliteal Fossa Ovoid brown 8 mm crust       A focused examination was performed including head, neck, arms, back, legs. Relevant physical exam findings are noted in the Assessment and Plan.   Assessment & Plan    Neoplasm of uncertain behavior of skin Left Popliteal Fossa  Skin / nail biopsy Type of biopsy: tangential   Informed consent: discussed and consent obtained   Timeout: patient name, date of birth, surgical site, and procedure verified   Anesthesia: the lesion was anesthetized in a standard fashion   Anesthetic:  1% lidocaine w/ epinephrine 1-100,000 local infiltration Instrument used: flexible razor blade   Hemostasis achieved with: aluminum chloride and electrodesiccation   Outcome: patient tolerated procedure well   Post-procedure details: wound care instructions given    Specimen 1 - Surgical pathology Differential Diagnosis: seb k  Check Margins: No      I, Lavonna Monarch, MD, have reviewed all documentation for this visit.  The documentation on 08/06/21 for the exam, diagnosis, procedures, and orders are all accurate and complete.

## 2021-12-06 ENCOUNTER — Ambulatory Visit: Payer: Medicare PPO | Admitting: Dermatology

## 2023-10-13 IMAGING — CT CT CARDIAC CORONARY ARTERY CALCIUM SCORE
2 of 3 series · 12 of 20 positions shown, 15 images · non-contrast
Comparison: None.

CLINICAL DATA: 70-year-old Caucasian male with history of
hyperlipidemia.

EXAM:
CT CARDIAC CORONARY ARTERY CALCIUM SCORE
TECHNIQUE: Non-contrast imaging through the heart was performed using
prospective ECG gating. Image post processing was performed on an
independent workstation, allowing for quantitative analysis of the
heart and coronary arteries. Note that this exam targets the heart
and the chest was not imaged in its entirety.

[Series 2: cascseq 2.0 d10f 70% · axial · 0.39mm/px · z∈[+1246,+1330]mm · 3 of 85 slices shown]
[im 22/85  vessel]
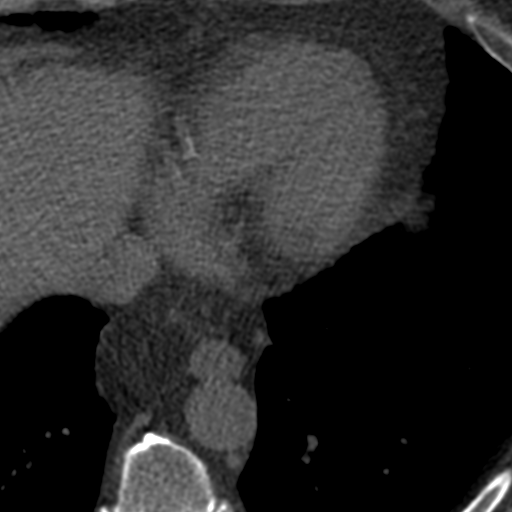
[im 43/85  vessel]
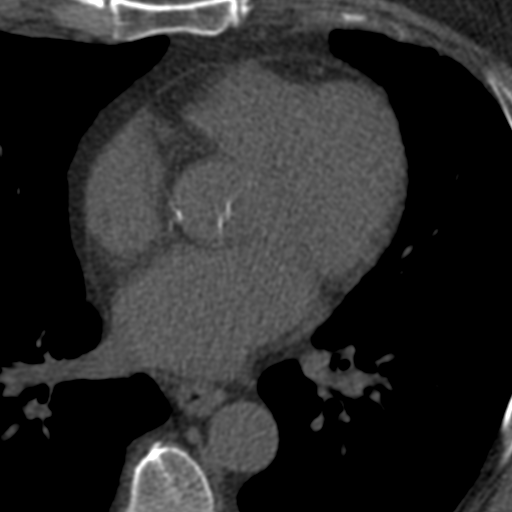
[im 64/85  vessel]
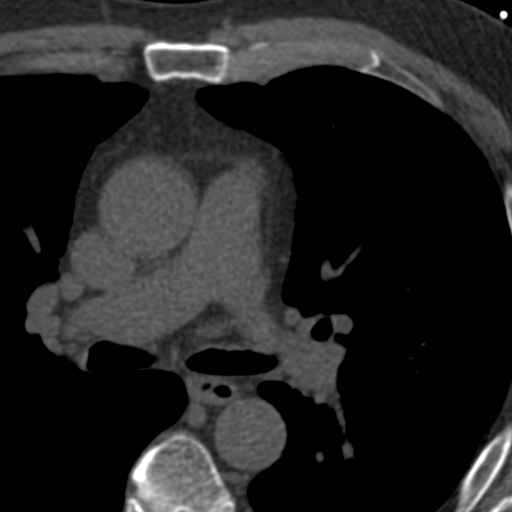

[Series 3: thin sfov · axial · 0.39mm/px · z∈[+1220,+1356]mm · 9 of 172 slices shown, 12 images]
[im 18/172  vessel]
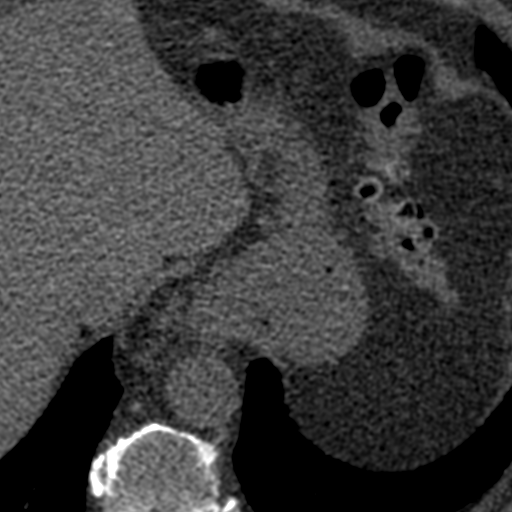
[im 18/172  lung]
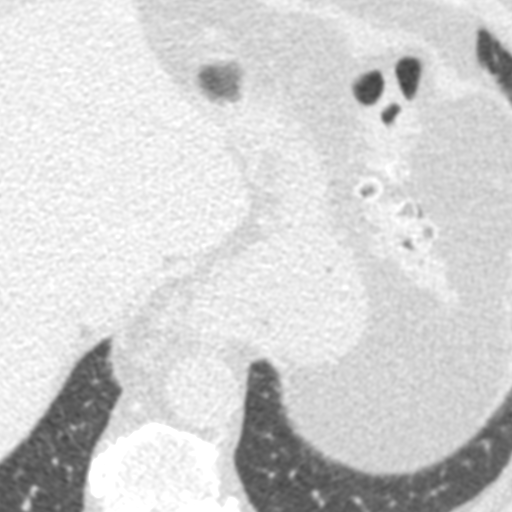
[im 35/172  vessel]
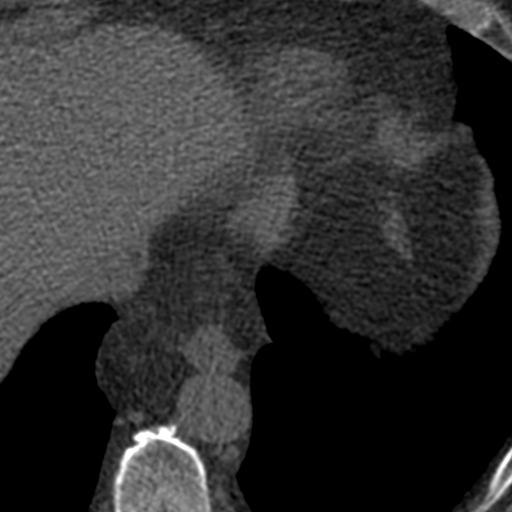
[im 52/172  vessel]
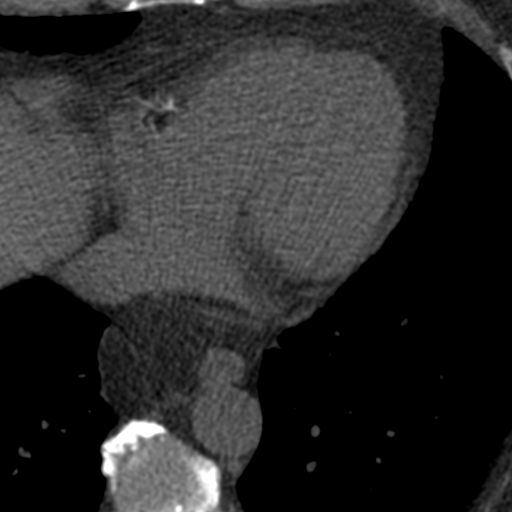
[im 69/172  vessel]
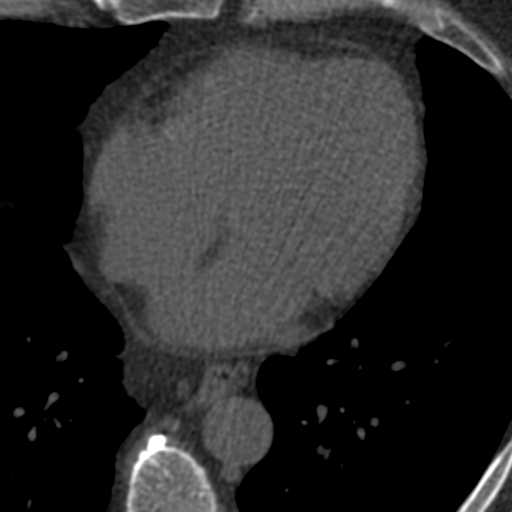
[im 86/172  vessel]
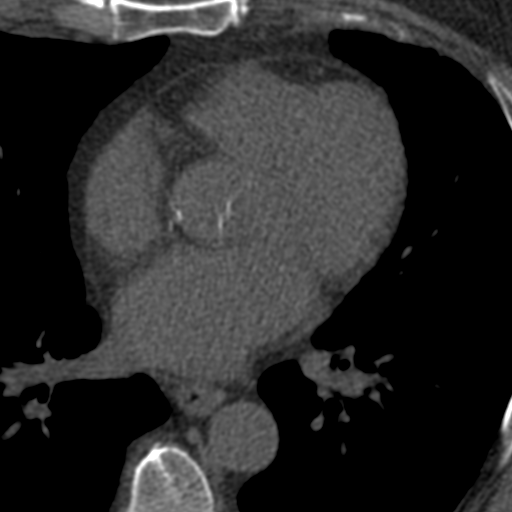
[im 86/172  lung]
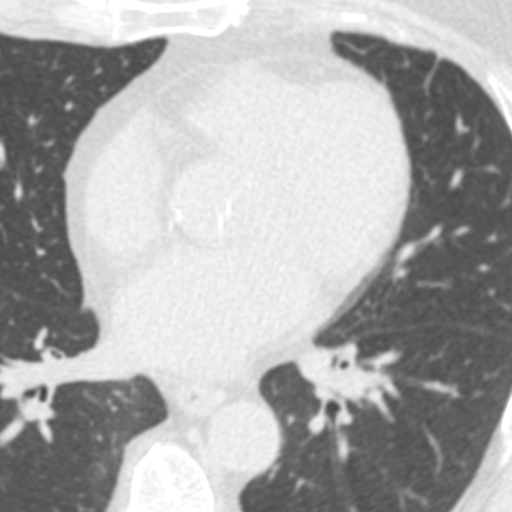
[im 103/172  vessel]
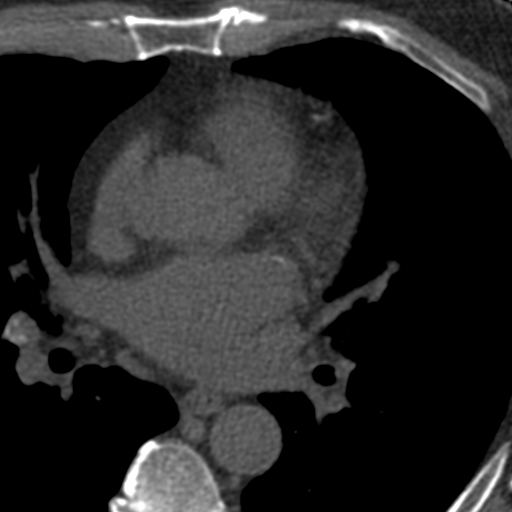
[im 120/172  vessel]
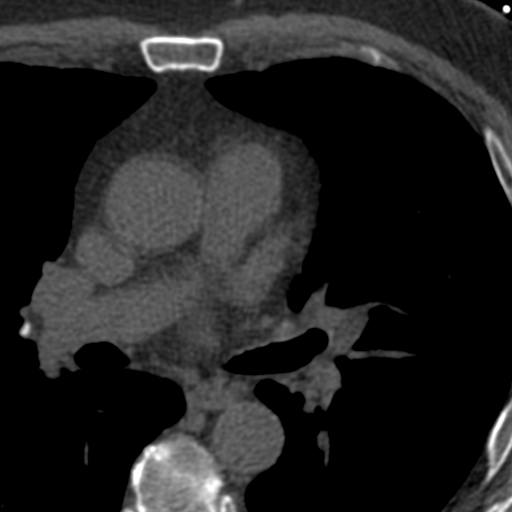
[im 137/172  vessel]
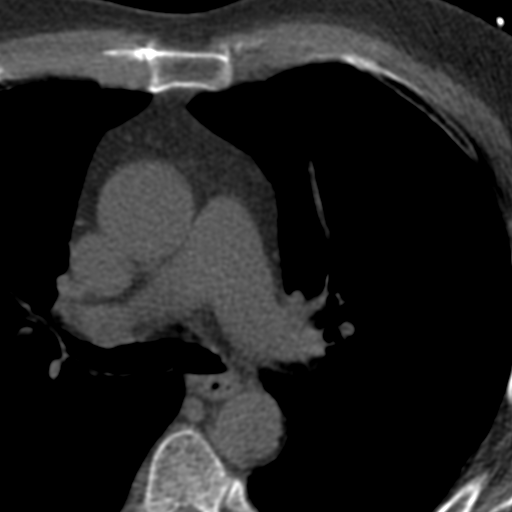
[im 154/172  vessel]
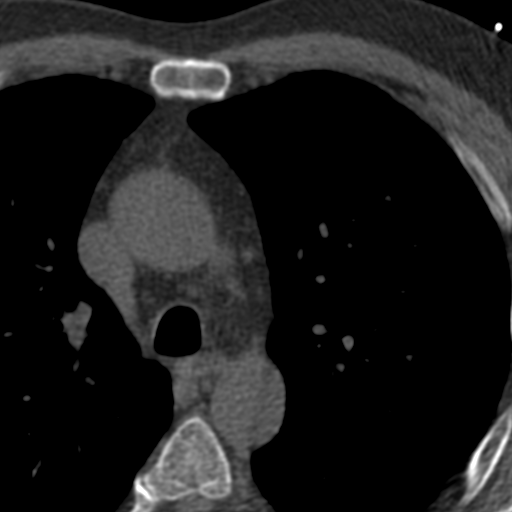
[im 154/172  lung]
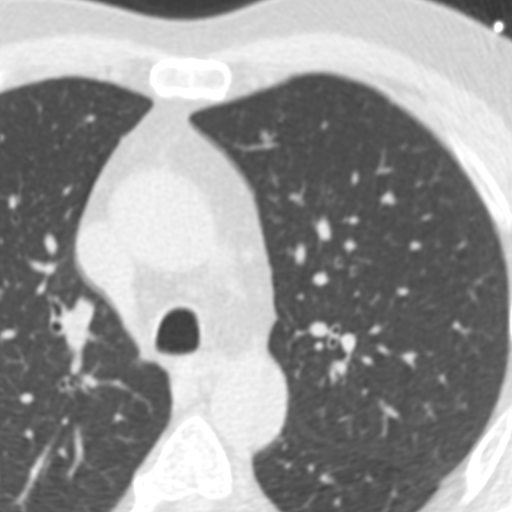

[12 of 20 positions shown; findings below may reference images not displayed]

FINDINGS: CORONARY CALCIUM SCORES:

Left Main: 0

LAD: 170

LCx:

RCA:

Total Agatston Score: 274

[HOSPITAL] percentile: 60

AORTA MEASUREMENTS:

Ascending Aorta: 36 mm

Descending Aorta: 27 mm

OTHER FINDINGS:

The heart size is within normal limits. The aortic valve is
calcified. No pericardial fluid is identified. Visualized segments
of the thoracic aorta and central pulmonary arteries are normal in
caliber. Visualized mediastinum and hilar regions demonstrate no
lymphadenopathy or masses. Calcified right hilar lymph nodes are
consistent with prior granulomatous disease. Visualized lungs show
no evidence of pulmonary edema, consolidation, pneumothorax or
pleural fluid. There is a calcified granuloma in the right lower
lobe. Visualized upper abdomen and bony structures are unremarkable.
IMPRESSION: 1. Coronary calcium score of 274 is at the 60th percentile for the
patient's age, sex and race.
2. Calcifications involving the aortic valve. Correlation with
echocardiography may be helpful.
3. Evidence of prior granulomatous disease with calcified granuloma
in the right lower lobe and calcified right hilar lymph nodes.

## 2023-12-11 ENCOUNTER — Other Ambulatory Visit: Payer: Self-pay | Admitting: Internal Medicine

## 2023-12-11 DIAGNOSIS — E785 Hyperlipidemia, unspecified: Secondary | ICD-10-CM

## 2023-12-14 ENCOUNTER — Inpatient Hospital Stay: Admission: RE | Admit: 2023-12-14 | Discharge: 2023-12-14 | Attending: Internal Medicine | Admitting: Internal Medicine

## 2023-12-14 DIAGNOSIS — E785 Hyperlipidemia, unspecified: Secondary | ICD-10-CM

## 2024-01-11 NOTE — Progress Notes (Signed)
 "    Cardiology Office Note   Date:  01/12/2024   ID:  Steven Rogers, DOB [birth year], MRN [id number]  PCP:  Steven Ingle, MD  Cardiologist:   None Referring:  Steven Ingle, MD  Chief Complaint  Patient presents with   Elevated coronary calcium       History of Present Illness: Steven Rogers is a 74 y.o. male who presents for evaluation of elevated coronary calcium .  In Dec 2025 he had a calcium  score of 740.  This had been 274 in 2023.      He has no past cardiac history.  The patient denies any new symptoms such as chest discomfort, neck or arm discomfort. There has been no new shortness of breath, PND or orthopnea. There have been no reported palpitations, presyncope or syncope.   He exercises routinely aggressively walking 2 miles a day on the elliptical and does some spin classes.  He does have a family history and the screening as above.  Past Medical History:  Diagnosis Date   Melanoma (HCC) 01/30/2019   UPPER MID BACK LEVEL 2 =EXC WITH DR Steven Rogers    Past Surgical History:  Procedure Laterality Date   DIAGNOSTIC LAPAROSCOPY     right hernia repair   HERNIA REPAIR     INGUINAL HERNIA REPAIR Left 02/04/2021   Procedure: OPEN LEFT INGUINAL HERNIA REPAIR WITH MESH;  Surgeon: Steven Calamity, MD;  Location: [facility] OR;  Service: General;  Laterality: Left;   MELANOMA EXCISION  2021   TONSILLECTOMY     taken out around 74 years old   UMBILICAL HERNIA REPAIR N/A 07/14/2020   Procedure: HERNIA REPAIR UMBILICAL ADULT WITH MESH;  Surgeon: Steven Calamity, MD;  Location: [facility] OR;  Service: General;  Laterality: N/A;   XI ROBOTIC ASSISTED INGUINAL HERNIA REPAIR WITH MESH Left 07/14/2020   Procedure: XI ROBOTIC ASSISTED LEFT INGUINAL HERNIA REPAIR WITH MESH;  Surgeon: Steven Calamity, MD;  Location: [facility] OR;  Service: General;  Laterality: Left;     Current Outpatient Medications  Medication Sig Dispense Refill   acetaminophen  (TYLENOL ) 500 MG tablet Take 1,000 mg by mouth every 6 (six)  hours as needed for moderate pain or mild pain.     atorvastatin  (LIPITOR) 20 MG tablet Take 20 mg by mouth daily.     cetirizine (ZYRTEC) 10 MG tablet Take 10 mg by mouth daily as needed for allergies.     Cholecalciferol (VITAMIN D3 PO) Take 1 tablet by mouth daily.     meloxicam (MOBIC) 15 MG tablet Take 15 mg by mouth daily.     Multiple Vitamin (MULTIVITAMIN WITH MINERALS) TABS tablet Take 1 tablet by mouth daily.     No current facility-administered medications for this visit.    Allergies:   Patient has no known allergies.    Social History:  The patient  reports that he has never smoked. He has never used smokeless tobacco. He reports current alcohol use. He reports that he does not use drugs.   Family History:  The patient's family history includes Coronary artery disease in his father.    ROS:  Please see the history of present illness.   Otherwise, review of systems are positive for none.   All other systems are reviewed and negative.    PHYSICAL EXAM: VS:  BP 132/74 (BP Location: Right Arm, Patient Position: Sitting, Cuff Size: Normal)   Pulse 82   Ht 6' (1.829 m)   Wt 225 lb (102.1 kg)   SpO2 95%  BMI 30.52 kg/m  , BMI Body mass index is 30.52 kg/m. GENERAL:  Well appearing HEENT:  Pupils equal round and reactive, fundi not visualized, oral mucosa unremarkable NECK:  No jugular venous distention, waveform within normal limits, carotid upstroke brisk and symmetric, no bruits, no thyromegaly LYMPHATICS:  No cervical, inguinal adenopathy LUNGS:  Clear to auscultation bilaterally BACK:  No CVA tenderness CHEST:  Unremarkable HEART:  PMI not displaced or sustained,S1 and S2 within normal limits, no S3, no S4, no clicks, no rubs, no murmurs ABD:  Flat, positive bowel sounds normal in frequency in pitch, no bruits, no rebound, no guarding, no midline pulsatile mass, no hepatomegaly, no splenomegaly EXT:  2 plus pulses throughout, no edema, no cyanosis no clubbing SKIN:   No rashes no nodules NEURO:  Cranial nerves II through XII grossly intact, motor grossly intact throughout [facility]:  Cognitively intact, oriented to person place and time    EKG:  EKG Interpretation Date/Time:  Friday January 12 2024 08:58:37 EST Ventricular Rate:  82 PR Interval:  192 QRS Duration:  84 QT Interval:  340 QTC Calculation: 397 R Axis:   -27  Text Interpretation: Normal sinus rhythm  Poor anterior R Wave progression No previous ECGs available Confirmed by Steven Rogers ([id number]) on 01/12/2024 9:15:23 AM     Recent Labs: No results found for requested labs within last 365 days.    Lipid Panel No results found for: CHOL, TRIG, HDL, CHOLHDL, VLDL, LDLCALC, LDLDIRECT    Wt Readings from Last 3 Encounters:  01/12/24 225 lb (102.1 kg)  02/04/21 220 lb (99.8 kg)  02/01/21 227 lb 11.2 oz (103.3 kg)      Other studies Reviewed: Additional studies/ records that were reviewed today include: Labs. Review of the above records demonstrates:  Please see elsewhere in the note.     ASSESSMENT AND PLAN:   Elevated coronary calcium :  He needs aggressive risk.    He has a very active lifestyle.  He has no symptoms.  I do not think further cardiovascular testing is suggested but he does need aggressive risk reduction.  We talked about the symptoms that would prompt further evaluation.  Dyslipidemia he had a reasonable lipid profile.  His LDL was 76 with an HDL of 61.  However, I would like to check an NMR, LP(a) and high-sensitivity C-reactive protein and very aggressively manage his profile given his family history.  Hypertension: His blood pressure is borderline.  We talked about goals of therapy.  Current medicines are reviewed at length with the patient today.  The patient does not have concerns regarding medicines.  The following changes have been made:  no change  Labs/ tests ordered today include:   Orders Placed This Encounter  Procedures    Lipoprotein A (LPA)   NMR, lipoprofile   CRP High sensitivity   EKG 12-Lead     Disposition:   FU with in one year .      Signed, Steven Lavona, MD  01/12/2024 9:46 AM    [facility]    "

## 2024-01-12 ENCOUNTER — Ambulatory Visit: Attending: Cardiology | Admitting: Cardiology

## 2024-01-12 ENCOUNTER — Encounter: Payer: Self-pay | Admitting: Cardiology

## 2024-01-12 VITALS — BP 132/74 | HR 82 | Ht 72.0 in | Wt 225.0 lb

## 2024-01-12 DIAGNOSIS — R931 Abnormal findings on diagnostic imaging of heart and coronary circulation: Secondary | ICD-10-CM

## 2024-01-12 DIAGNOSIS — E785 Hyperlipidemia, unspecified: Secondary | ICD-10-CM

## 2024-01-12 NOTE — Patient Instructions (Signed)
 Medication Instructions:  Your physician recommends that you continue on your current medications as directed. Please refer to the Current Medication list given to you today.  *If you need a refill on your cardiac medications before your next appointment, please call your pharmacy*  Lab Work: Lpa, NMR, High Sensitivity CRP at [facility] If you have labs (blood work) drawn today and your tests are completely normal, you will receive your results only by: MyChart Message (if you have MyChart) OR A paper copy in the mail If you have any lab test that is abnormal or we need to change your treatment, we will call you to review the results.  Testing/Procedures: NONE  Follow-Up: At [facility], you and your health needs are our priority.  As part of our continuing mission to provide you with exceptional heart care, our providers are all part of one team.  This team includes your primary Cardiologist (physician) and Advanced Practice Providers or APPs (Physician Assistants and Nurse Practitioners) who all work together to provide you with the care you need, when you need it.  Your next appointment:   1 year(s)  Provider:   Lavona, MD  We recommend signing up for the patient portal called MyChart.  Sign up information is provided on this After Visit Summary.  MyChart is used to connect with patients for Virtual Visits (Telemedicine).  Patients are able to view lab/test results, encounter notes, upcoming appointments, etc.  Non-urgent messages can be sent to your provider as well.   To learn more about what you can do with MyChart, go to [email or web address].

## 2024-01-16 LAB — HIGH SENSITIVITY CRP: CRP, High Sensitivity: 1.45 mg/L (ref 0.00–3.00)

## 2024-01-16 LAB — NMR, LIPOPROFILE
Cholesterol, Total: 152 mg/dL (ref 100–199)
HDL Particle Number: 39.8 umol/L
HDL-C: 69 mg/dL
LDL Particle Number: 712 nmol/L
LDL Size: 21.7 nm
LDL-C (NIH Calc): 69 mg/dL (ref 0–99)
LP-IR Score: <25
Small LDL Particle Number: 233 nmol/L
Triglycerides: 72 mg/dL (ref 0–149)

## 2024-01-16 LAB — LIPOPROTEIN A (LPA): Lipoprotein (a): 194.9 nmol/L — ABNORMAL HIGH

## 2024-01-17 ENCOUNTER — Ambulatory Visit: Payer: Self-pay | Admitting: Cardiology

## 2024-01-17 DIAGNOSIS — E785 Hyperlipidemia, unspecified: Secondary | ICD-10-CM

## 2024-01-19 MED ORDER — ATORVASTATIN CALCIUM 80 MG PO TABS: 80.0000 mg | ORAL_TABLET | Freq: Every day | ORAL | 3 refills | Status: AC

## 2024-01-19 NOTE — Telephone Encounter (Signed)
-----   Message from Lynwood Schilling, MD sent at 01/17/2024  9:58 AM EST ----- His LDL is mildly elevated above target and LPa is significantly elevated.  I would like to increase his Lipitor to 80 mg and repeat a NMR in 3 months.  Call Mr. Griebel with the results and send results  to Henry Ingle, MD

## 2024-01-19 NOTE — Telephone Encounter (Signed)
 Spoke with pt regarding his results. Pt agreeable to plan. Prescription sent. Lab ordered and released. Pt verbalized understanding. All questions if any were answered.
